# Patient Record
Sex: Female | Born: 1955 | Race: White | Hispanic: No | Marital: Married | Smoking: Never smoker
Health system: Southern US, Community
[De-identification: ages and names within clinical notes are randomized; demographics above are authoritative.]

---

## 2003-02-28 ENCOUNTER — Other Ambulatory Visit: Admission: RE | Admit: 2003-02-28 | Discharge: 2003-02-28 | Payer: Self-pay | Admitting: Obstetrics & Gynecology

## 2003-03-17 ENCOUNTER — Emergency Department (HOSPITAL_COMMUNITY): Admission: EM | Admit: 2003-03-17 | Discharge: 2003-03-17 | Payer: Self-pay | Admitting: Emergency Medicine

## 2003-03-17 ENCOUNTER — Encounter: Payer: Self-pay | Admitting: Emergency Medicine

## 2003-03-19 ENCOUNTER — Encounter: Admission: RE | Admit: 2003-03-19 | Discharge: 2003-03-19 | Payer: Self-pay | Admitting: Obstetrics and Gynecology

## 2003-03-19 ENCOUNTER — Encounter: Payer: Self-pay | Admitting: Obstetrics & Gynecology

## 2004-03-25 ENCOUNTER — Other Ambulatory Visit: Admission: RE | Admit: 2004-03-25 | Discharge: 2004-03-25 | Payer: Self-pay | Admitting: Obstetrics & Gynecology

## 2004-06-16 ENCOUNTER — Encounter: Admission: RE | Admit: 2004-06-16 | Discharge: 2004-06-16 | Payer: Self-pay | Admitting: Obstetrics & Gynecology

## 2005-05-06 ENCOUNTER — Emergency Department (HOSPITAL_COMMUNITY): Admission: EM | Admit: 2005-05-06 | Discharge: 2005-05-06 | Payer: Self-pay | Admitting: Emergency Medicine

## 2006-11-01 ENCOUNTER — Other Ambulatory Visit: Admission: RE | Admit: 2006-11-01 | Discharge: 2006-11-01 | Payer: Self-pay | Admitting: Family Medicine

## 2007-01-05 ENCOUNTER — Emergency Department (HOSPITAL_COMMUNITY): Admission: EM | Admit: 2007-01-05 | Discharge: 2007-01-05 | Payer: Self-pay | Admitting: Emergency Medicine

## 2007-03-08 ENCOUNTER — Ambulatory Visit (HOSPITAL_COMMUNITY): Admission: RE | Admit: 2007-03-08 | Discharge: 2007-03-08 | Payer: Self-pay | Admitting: Gastroenterology

## 2007-04-05 ENCOUNTER — Encounter: Admission: RE | Admit: 2007-04-05 | Discharge: 2007-04-05 | Payer: Self-pay | Admitting: Family Medicine

## 2007-05-13 ENCOUNTER — Emergency Department (HOSPITAL_COMMUNITY): Admission: EM | Admit: 2007-05-13 | Discharge: 2007-05-13 | Payer: Self-pay | Admitting: *Deleted

## 2008-04-10 ENCOUNTER — Encounter: Admission: RE | Admit: 2008-04-10 | Discharge: 2008-04-10 | Payer: Self-pay | Admitting: Family Medicine

## 2008-07-21 ENCOUNTER — Emergency Department (HOSPITAL_COMMUNITY): Admission: EM | Admit: 2008-07-21 | Discharge: 2008-07-21 | Payer: Self-pay | Admitting: Emergency Medicine

## 2008-08-14 ENCOUNTER — Other Ambulatory Visit: Admission: RE | Admit: 2008-08-14 | Discharge: 2008-08-14 | Payer: Self-pay | Admitting: Family Medicine

## 2008-09-11 ENCOUNTER — Ambulatory Visit: Payer: Self-pay | Admitting: Family Medicine

## 2009-02-20 ENCOUNTER — Emergency Department (HOSPITAL_COMMUNITY): Admission: EM | Admit: 2009-02-20 | Discharge: 2009-02-20 | Payer: Self-pay | Admitting: Family Medicine

## 2009-03-20 ENCOUNTER — Emergency Department (HOSPITAL_COMMUNITY): Admission: EM | Admit: 2009-03-20 | Discharge: 2009-03-20 | Payer: Self-pay | Admitting: Emergency Medicine

## 2009-07-10 ENCOUNTER — Ambulatory Visit: Payer: Self-pay | Admitting: Family Medicine

## 2009-07-10 DIAGNOSIS — R209 Unspecified disturbances of skin sensation: Secondary | ICD-10-CM

## 2009-07-10 DIAGNOSIS — IMO0001 Reserved for inherently not codable concepts without codable children: Secondary | ICD-10-CM | POA: Insufficient documentation

## 2009-07-16 ENCOUNTER — Encounter: Payer: Self-pay | Admitting: Family Medicine

## 2009-07-17 ENCOUNTER — Encounter: Payer: Self-pay | Admitting: Family Medicine

## 2009-07-17 LAB — CONVERTED CEMR LAB
AST: 21 units/L (ref 0–37)
Albumin: 4.2 g/dL (ref 3.5–5.2)
Alkaline Phosphatase: 65 units/L (ref 39–117)
CO2: 23 meq/L (ref 19–32)
Cholesterol: 188 mg/dL (ref 0–200)
HDL: 54 mg/dL (ref >39)
LDL Cholesterol: 118 mg/dL — ABNORMAL HIGH (ref 0–99)
Sodium: 141 meq/L (ref 135–145)
TSH: 12.294 microintl units/mL — ABNORMAL HIGH (ref 0.350–4.500)
Total Bilirubin: 0.5 mg/dL (ref 0.3–1.2)
Total CK: 78 units/L (ref 7–177)
Total Protein: 7.3 g/dL (ref 6.0–8.3)
Triglycerides: 82 mg/dL (ref <150)

## 2009-07-18 LAB — CONVERTED CEMR LAB: T3, Free: 2.8 pg/mL (ref 2.3–4.2)

## 2009-07-29 ENCOUNTER — Telehealth (INDEPENDENT_AMBULATORY_CARE_PROVIDER_SITE_OTHER): Payer: Self-pay | Admitting: *Deleted

## 2009-07-30 ENCOUNTER — Telehealth: Payer: Self-pay | Admitting: Family Medicine

## 2009-08-11 ENCOUNTER — Ambulatory Visit: Payer: Self-pay | Admitting: Family Medicine

## 2009-08-11 DIAGNOSIS — E039 Hypothyroidism, unspecified: Secondary | ICD-10-CM | POA: Insufficient documentation

## 2009-08-11 DIAGNOSIS — F411 Generalized anxiety disorder: Secondary | ICD-10-CM | POA: Insufficient documentation

## 2009-08-15 ENCOUNTER — Telehealth: Payer: Self-pay | Admitting: Family Medicine

## 2009-09-03 ENCOUNTER — Encounter: Payer: Self-pay | Admitting: Family Medicine

## 2009-09-04 ENCOUNTER — Encounter: Admission: RE | Admit: 2009-09-04 | Discharge: 2009-09-04 | Payer: Self-pay | Admitting: Internal Medicine

## 2009-09-09 ENCOUNTER — Telehealth: Payer: Self-pay | Admitting: Family Medicine

## 2009-10-14 ENCOUNTER — Ambulatory Visit: Payer: Self-pay | Admitting: Family Medicine

## 2009-10-14 DIAGNOSIS — J0110 Acute frontal sinusitis, unspecified: Secondary | ICD-10-CM

## 2009-11-14 ENCOUNTER — Telehealth: Payer: Self-pay | Admitting: Family Medicine

## 2009-12-05 ENCOUNTER — Telehealth (INDEPENDENT_AMBULATORY_CARE_PROVIDER_SITE_OTHER): Payer: Self-pay | Admitting: *Deleted

## 2009-12-09 ENCOUNTER — Ambulatory Visit: Payer: Self-pay | Admitting: Family Medicine

## 2009-12-11 ENCOUNTER — Telehealth: Payer: Self-pay | Admitting: Family Medicine

## 2009-12-15 ENCOUNTER — Ambulatory Visit: Payer: Self-pay | Admitting: Family Medicine

## 2009-12-22 ENCOUNTER — Telehealth: Payer: Self-pay | Admitting: Family Medicine

## 2010-04-09 IMAGING — US US SOFT TISSUE HEAD/NECK
1 series · 14 of 25 positions shown · non-contrast
Comparison: [HOSPITAL] chest x-ray 02/20/2009.

CLINICAL DATA: Goiter.

THYROID ULTRASOUND
TECHNIQUE: Ultrasound examination of the thyroid gland and
adjacent soft tissues was performed.

[Series 1: us soft tissue head/neck · 0.05mm/px · 14 of 44 slices shown]
[im 1/44]
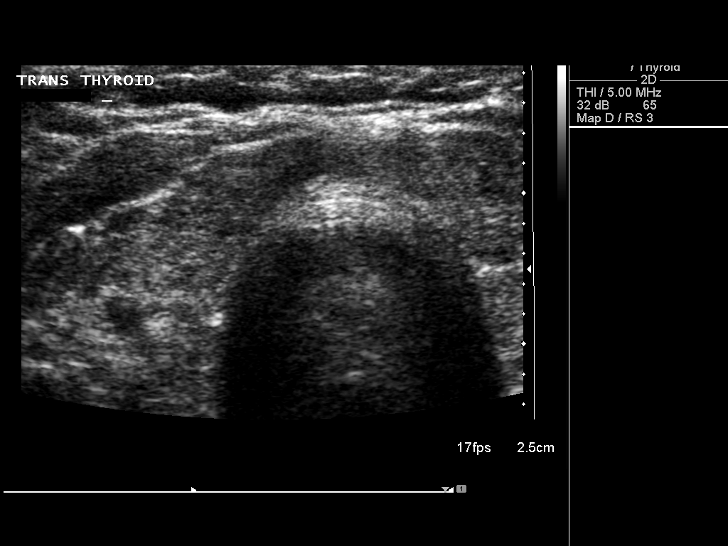
[im 4/44]
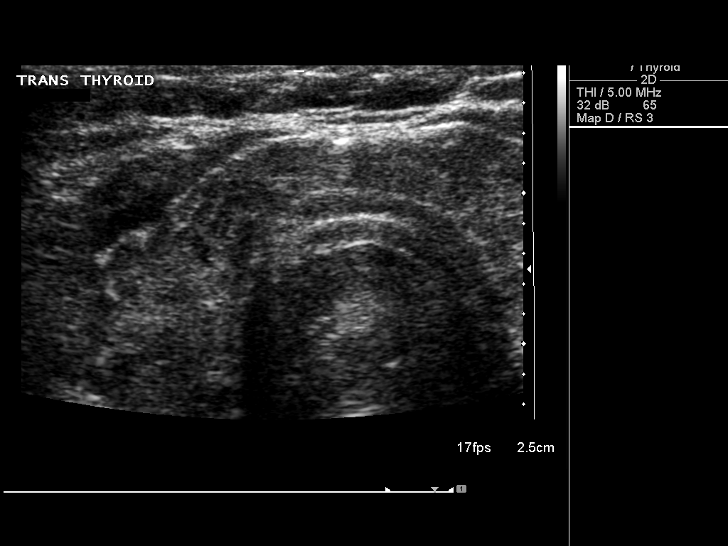
[im 8/44]
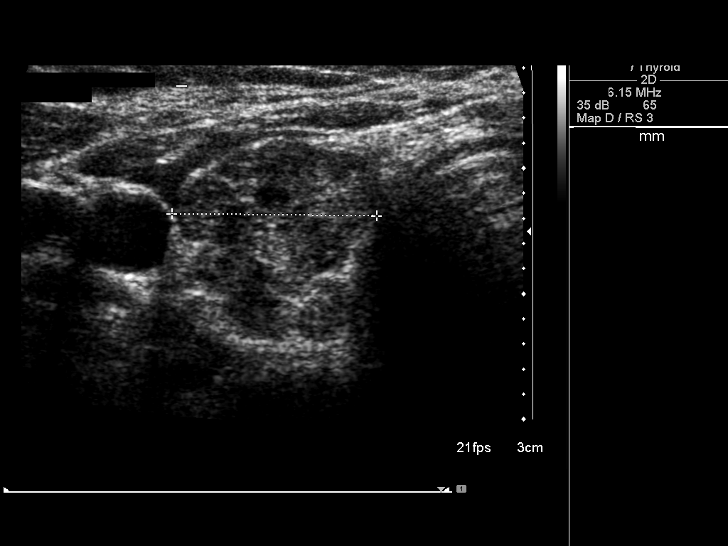
[im 11/44]
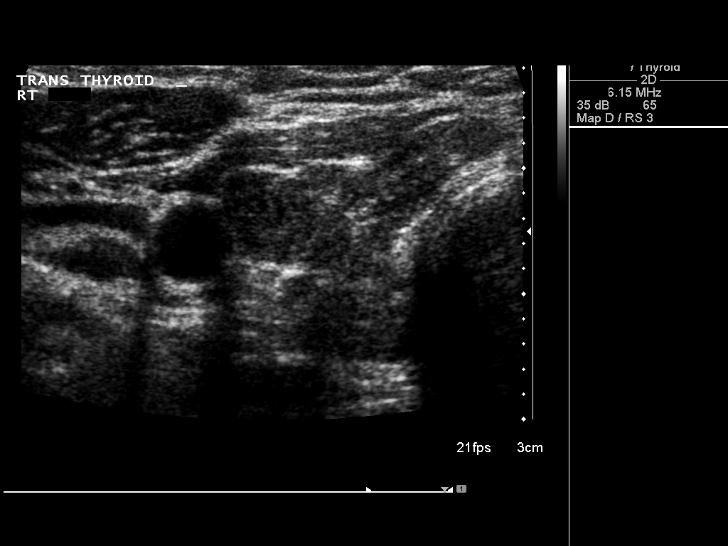
[im 15/44]
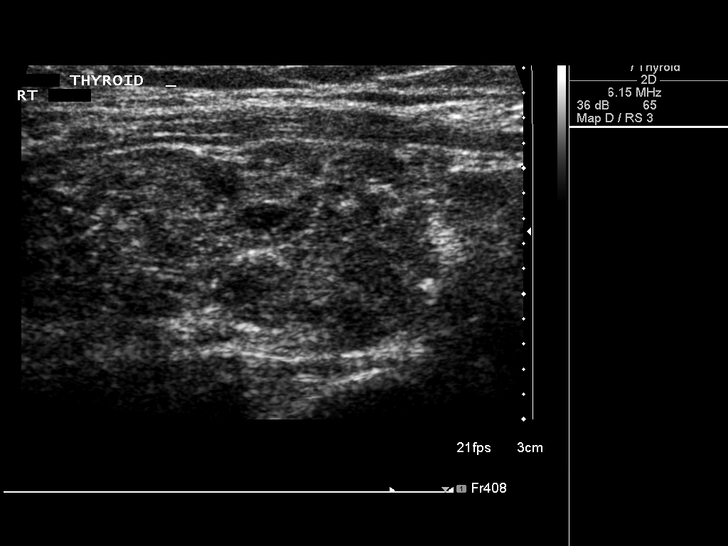
[im 17/44]
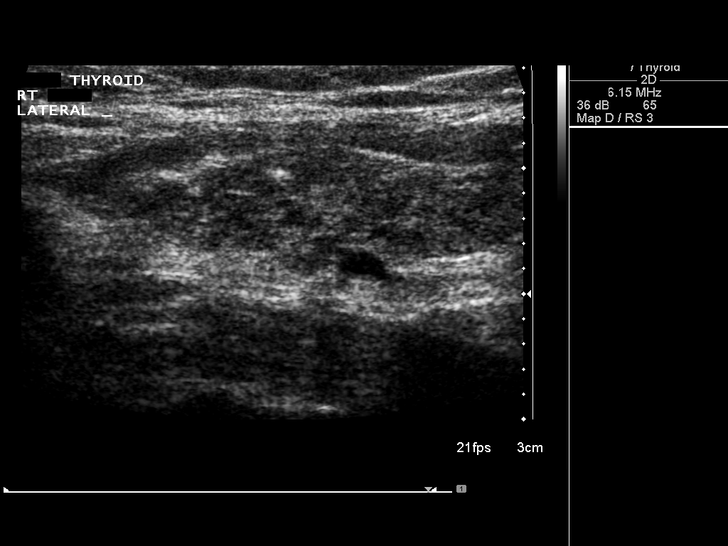
[im 20/44]
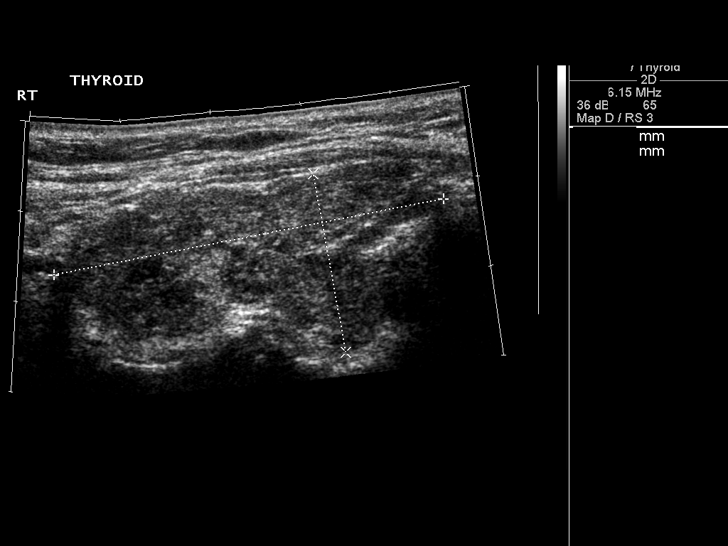
[im 24/44]
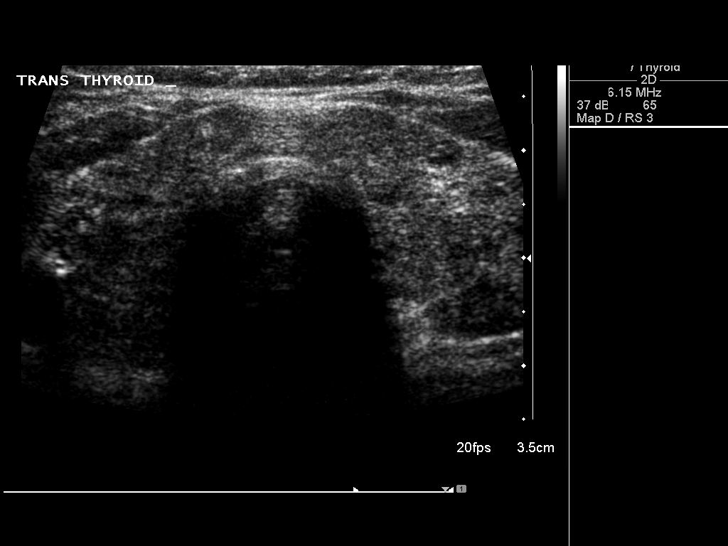
[im 27/44]
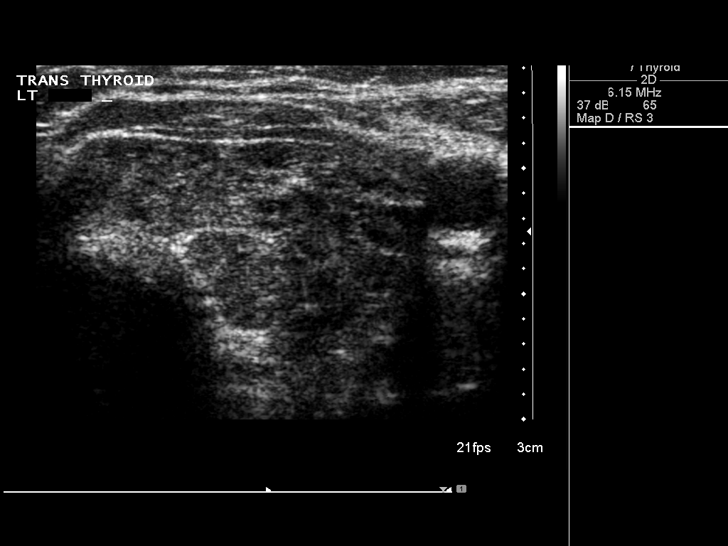
[im 29/44]
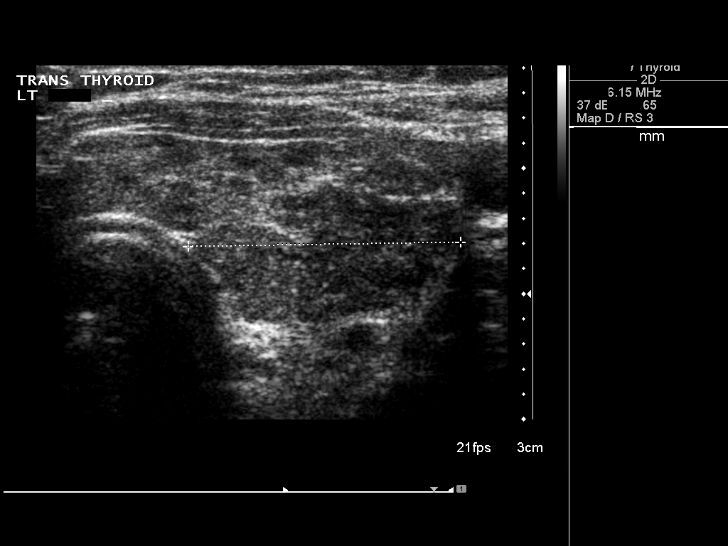
[im 33/44]
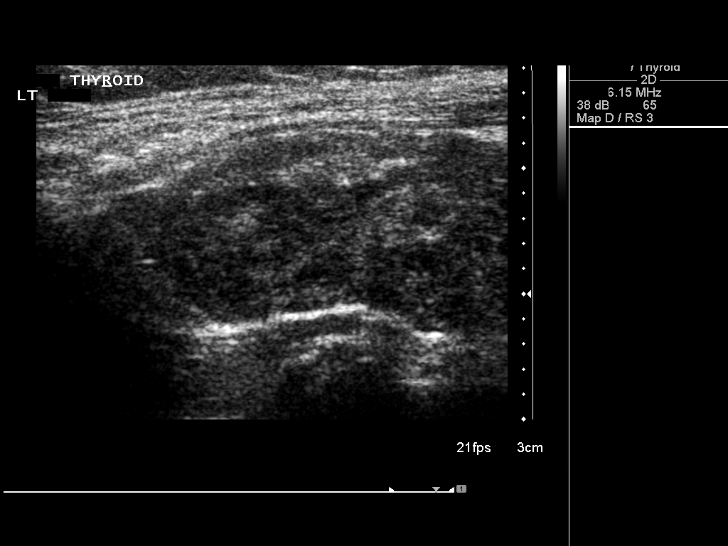
[im 36/44]
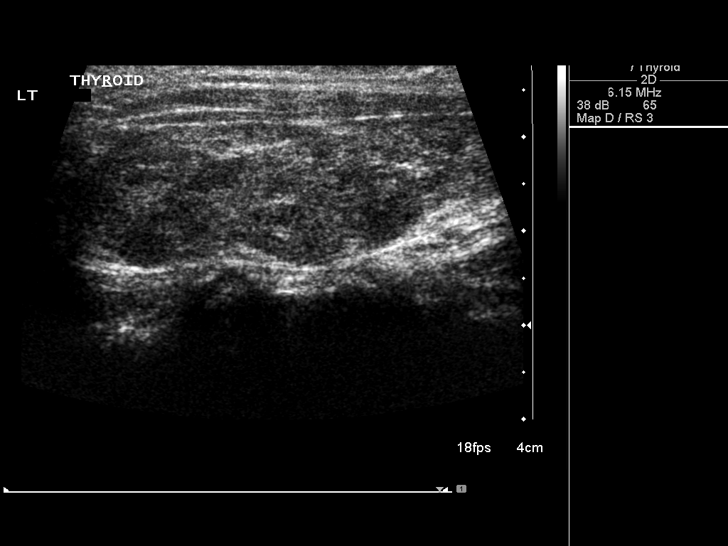
[im 40/44]
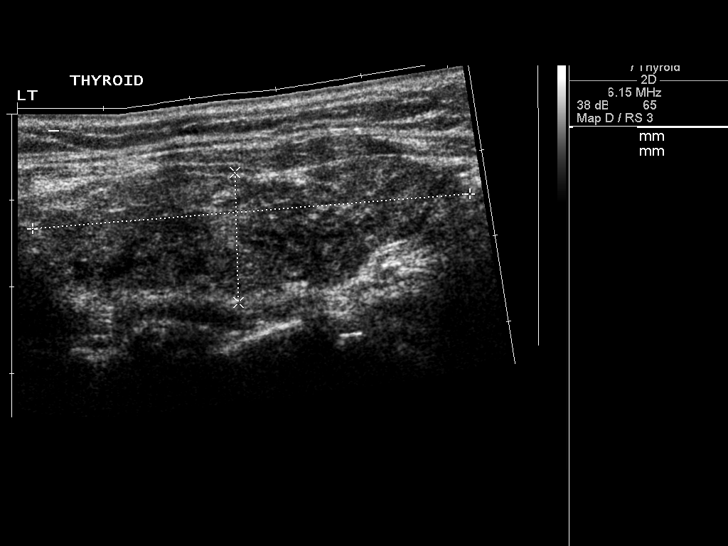
[im 44/44]
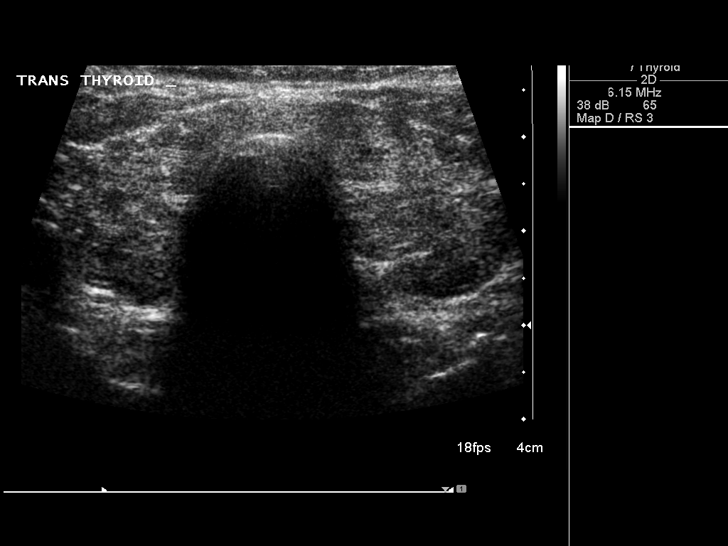

[14 of 25 positions shown; findings below may reference images not displayed]

FINDINGS: Thyroid isthmus measures upper limits of normal at 4 mm
AP thickness.  Remaining thyroid is upper limits of normal in size
with right lobe measuring 5.6 cm long X 2.0 cm AP X 1.6 cm wide and
left lobe 5.1 cm long X 1.5 cm AP X 2.2 cm wide.  Thyroid
echotexture is diffusely inhomogeneous.  No focal solid or cystic
nodules visualized.
IMPRESSION: 1.  Heterogeneous thyroid gland upper limits of normal in size with
no focal lesions consistent with subtle goiter and/or chronic
thyroiditis - need clinical correlation.
2.  No focal thyroid nodule.

## 2010-06-30 ENCOUNTER — Telehealth: Payer: Self-pay | Admitting: Family Medicine

## 2010-09-01 ENCOUNTER — Ambulatory Visit: Payer: Self-pay | Admitting: Family Medicine

## 2010-09-01 DIAGNOSIS — M549 Dorsalgia, unspecified: Secondary | ICD-10-CM | POA: Insufficient documentation

## 2010-09-08 ENCOUNTER — Encounter: Payer: Self-pay | Admitting: Family Medicine

## 2010-09-09 LAB — CONVERTED CEMR LAB
Alkaline Phosphatase: 73 units/L (ref 39–117)
Glucose, Bld: 92 mg/dL (ref 70–99)
Potassium: 4.1 meq/L (ref 3.5–5.3)
Sodium: 141 meq/L (ref 135–145)
Total Bilirubin: 0.6 mg/dL (ref 0.3–1.2)
Total CHOL/HDL Ratio: 4.1

## 2010-11-25 ENCOUNTER — Encounter: Payer: Self-pay | Admitting: Family Medicine

## 2010-12-07 ENCOUNTER — Encounter
Admission: RE | Admit: 2010-12-07 | Discharge: 2010-12-07 | Payer: Self-pay | Source: Home / Self Care | Attending: Family Medicine | Admitting: Family Medicine

## 2010-12-29 NOTE — Assessment & Plan Note (Signed)
Summary: f/u GAD   Vital Signs:  Patient profile:   55 year old female Menstrual status:  postmenopausal Height:      68 inches Weight:      196 pounds BMI:     29.91 O2 Sat:      96 % on Room air Pulse rate:   76 / minute BP sitting:   106 / 75  (left arm) Cuff size:   regular  Vitals Entered By: Payton Spark CMA (December 15, 2009 10:45 AM)  O2 Flow:  Room air CC: F/U anxiety. D/Cd alprazolam and doing well on klonopin   Primary Care Provider:  Seymour Bars DO  CC:  F/U anxiety. D/Cd alprazolam and doing well on klonopin.  History of Present Illness: 55 yo WF presents for f/u GAD.  She is taking 1/3 of a Klonopin q 8 hrs.  That seems to be working well.  She was on Xanax prior to this but felt like she was getting addicted.  She is having problems with her daughter and now her mother in law living with them.  She is starting family counseling soon.    Her husband is her support system.  Not drinking ETOH.  Sleeping better now.   She was tried on SSRIs in the past but had nightmares, GI upset amongst other problems.    Current Medications (verified): 1)  Klonopin 1mg  .... 1/2 Tab Two Times A Day For Anxiety  Allergies: 1)  ! Penicillin 2)  ! Levothroid (Levothyroxine Sodium)  Review of Systems      See HPI  Physical Exam  General:  alert, well-developed, well-nourished, and well-hydrated.   Skin:  color normal.   Psych:  good eye contact, not anxious appearing, and not depressed appearing.     Impression & Recommendations:  Problem # 1:  ANXIETY (ICD-300.00) Assessment Improved Improved with Clonazepam, off Alprazolam now. She is taking 1/3 tab q 8 hrs.  Starting counseling and has failed SSRIs by hx. Continue to follow. The following medications were removed from the medication list:    Alprazolam 0.25 Mg Tabs (Alprazolam) .Marland Kitchen... 1 tab by mouth three times a day as needed anxiety Her updated medication list for this problem includes:    Clonazepam 1 Mg Tabs  (Clonazepam) .Marland Kitchen... 1/2 tabs by mouth two times a day as needed anxiety  Complete Medication List: 1)  Clonazepam 1 Mg Tabs (Clonazepam) .... 1/2 tabs by mouth two times a day as needed anxiety  Patient Instructions: 1)  RFd Clonazepam. 2)  Start counseling. 3)  Spend time to relax each day. Prescriptions: CLONAZEPAM 1 MG TABS (CLONAZEPAM) 1/2 tabs by mouth two times a day as needed anxiety  #30 x 1   Entered and Authorized by:   Seymour Bars DO   Signed by:   Seymour Bars DO on 12/15/2009   Method used:   Printed then faxed to ...       [facility]  [place] [phone][facility] (retail[facility]       [address] [place], [place]  [address]       Ph: [phone] or [phone]       Fax: [phone]   RxID:   [id number]

## 2010-12-29 NOTE — Assessment & Plan Note (Signed)
Summary: VISIT/KH   Vital Signs:  Patient Profile:   55 Years Old Female CC:      Refill Klonapin Height:     68 inches O2 Sat:      95 % O2 treatment:    Room Air Temp:     98.3 degrees F oral Pulse rate:   77 / minute Pulse rhythm:   regular Resp:     12 per minute BP sitting:   117 / 82  (right arm) Cuff size:   regular  Vitals Entered By: Emilio Math (December 09, 2009 12:11 PM)                  Current Allergies: ! PENICILLIN ! LEVOTHROID (LEVOTHYROXINE SODIUM)History of Present Illness History from: patient Chief Complaint: Refill Klonapin History of Present Illness: xanax from [facility]md not controlling anxiety and panic which has been present for past 3 months and lmd office closed today, desires klonopin which has worked in the past  Current Meds GABAPENTIN 300 MG CAPS (GABAPENTIN) 1 capsule by mouth at bedtime x 1 wk then 2 capsules by mouth qhs MELOXICAM 7.5 MG TABS (MELOXICAM) 1-2 tabs by mouth daily with food as needed for back pain ALPRAZOLAM 0.25 MG TABS (ALPRAZOLAM) 1 tab by mouth three times a day as needed anxiety CEFTIN 250 MG TABS (CEFUROXIME AXETIL) 1 tab by mouth q 12 hrs x 10 days * KLONOPIN 1MG  1/2 tab two times a day for anxiety  REVIEW OF SYSTEMS Constitutional Symptoms      Denies fever, chills, night sweats, weight loss, weight gain, and fatigue.  Eyes       Denies change in vision, eye pain, eye discharge, glasses, contact lenses, and eye surgery. Ear/Nose/Throat/Mouth       Denies hearing loss/aids, change in hearing, ear pain, ear discharge, dizziness, frequent runny nose, frequent nose bleeds, sinus problems, sore throat, hoarseness, and tooth pain or bleeding.  Respiratory       Denies dry cough, productive cough, wheezing, shortness of breath, asthma, bronchitis, and emphysema/COPD.  Cardiovascular       Denies murmurs, chest pain, and tires easily with exhertion.    Gastrointestinal       Denies stomach pain, nausea/vomiting, diarrhea,  constipation, blood in bowel movements, and indigestion. Genitourniary       Denies painful urination, kidney stones, and loss of urinary control. Neurological       Denies paralysis, seizures, and fainting/blackouts. Musculoskeletal       Denies muscle pain, joint pain, joint stiffness, decreased range of motion, redness, swelling, muscle weakness, and gout.  Skin       Denies bruising, unusual mles/lumps or sores, and hair/skin or nail changes.  Psych       Complains of depression and sleep problems.      Denies mood changes, temper/anger issues, anxiety/stress, and speech problems.  Past History:  Past Medical History: Reviewed history from 10/14/2009 and no changes required. ? MS -- Dr Isabell Jarvis hypothyroidism-- Dr Katrine Coho Postmenopausal  Past Surgical History: Reviewed history from 07/10/2009 and no changes required. none PMH-FH-SH reviewed-no changes except otherwise noted  Family History: Reviewed history from 07/10/2009 and no changes required. sister endometriosis brother glaucoma mother breast cancer 52s, T2DM father renal cancer, died at 65, AMI < 37.  Social History: Reviewed history from 07/10/2009 and no changes required. [facility] for adopted granddaughters. Married to [place]. Quit smoking in 2000. Denies ETOH. No regular exercise.  Fair diet. Physical Exam General appearance: well  developed, well nourished, no acute distress MSE: oriented to time, place, and person,calm and coop Assessment New Problems: ANXIETY (ICD-300.00)   Plan New Medications/Changes: KLONOPIN 1MG  1/2 tab two times a day for anxiety  #10 x 0, 12/09/2009, Linna Hoff MD   The patient and/or caregiver has been counseled thoroughly with regard to medications prescribed including dosage, schedule, interactions, rationale for use, and possible side effects and they verbalize understanding.  Diagnoses and expected course of recovery discussed and will return if not improved as expected or  if the condition worsens. Patient and/or caregiver verbalized understanding.  Prescriptions: KLONOPIN 1MG  1/2 tab two times a day for anxiety  #10 x 0   Entered and Authorized by:   Linna Hoff MD   Signed by:   Linna Hoff MD on 12/09/2009   Method used:   Telephoned to ...       [facility]  [place] [phone]* (retail)       [address] [place], [place]  [address]       Ph: [phone] or [phone]       Fax: [phone]   RxID:   [id number]

## 2010-12-29 NOTE — Assessment & Plan Note (Signed)
Summary: f/u anxiety   Vital Signs:  Patient profile:   55 year old female Menstrual status:  postmenopausal Height:      68 inches Weight:      203 pounds BMI:     30.98 O2 Sat:      96 % on Room air Pulse rate:   73 / minute BP sitting:   113 / 83  (left arm) Cuff size:   regular  Vitals Entered By: Payton Spark CMA (September 01, 2010 1:18 PM)  O2 Flow:  Room air CC: F/U    Primary Care Emmette Katt:  Seymour Bars DO  CC:  F/U .  History of Present Illness: 55 yo WF presents for f/u visit.  She went to the gym yesterday and she fell.  She tripped.  She twisted and her thoracic back and has some pain and spasm today.  Has some muscle spasm.    She is due for fasting labs. Her anxiety has been fairly good.  She is taking 14/ of Klonopin in the AM and 1/2 tab at night which seems to be working well.    Her endocrinologist just restarted her thyroid medicine.    Current Medications (verified): 1)  Clonazepam 1 Mg Tabs (Clonazepam) .... 1/2 Tabs By Mouth Two Times A Day As Needed Anxiety 2)  Omeprazole 40 Mg Cpdr (Omeprazole) .Marland Kitchen.. 1 Tab By Mouth Daily, 30 Min Prior To Breakfast 3)  Aspirin 81 Mg Tbec (Aspirin) .... Take 1 Tab By Mouth Once Daily  Allergies (verified): 1)  ! Penicillin 2)  ! Levothroid (Levothyroxine Sodium)  Past History:  Past Medical History: Reviewed history from 10/14/2009 and no changes required. ? MS -- Dr Isabell Jarvis hypothyroidism-- Dr Katrine Coho Postmenopausal  Social History: Reviewed history from 07/10/2009 and no changes required. [facility] for adopted granddaughters. Married to [place]. Quit smoking in 2000. Denies ETOH. No regular exercise.  Fair diet.  Review of Systems      See HPI  Physical Exam  General:  alert, well-developed, well-nourished, and well-hydrated.   Head:  normocephalic and atraumatic.   Mouth:  good dentition and pharynx pink and moist.   Lungs:  Normal respiratory effort, chest expands symmetrically. Lungs are clear to  auscultation, no crackles or wheezes. Heart:  Normal rate and regular rhythm. S1 and S2 normal without gallop, murmur, click, rub or other extra sounds. Msk:  muscle spasm over the L upper thoracics  good ROM.   Skin:  color normal.   Cervical Nodes:  No lymphadenopathy noted Psych:  good eye contact, not anxious appearing, and not depressed appearing.     Impression & Recommendations:  Problem # 1:  BACK PAIN (ICD-724.5) Muscle spasm from falling.  Treat with heat, stretching and NSAIDs. Her updated medication list for this problem includes:    Aspirin 81 Mg Tbec (Aspirin) .Marland Kitchen... Take 1 tab by mouth once daily  Problem # 2:  ANXIETY (ICD-300.00) Will treat iwth Clonazepam as needed.  Working well. Her updated medication list for this problem includes:    Clonazepam 1 Mg Tabs (Clonazepam) .Marland Kitchen... 1/2 tabs by mouth two times a day as needed anxiety  Complete Medication List: 1)  Clonazepam 1 Mg Tabs (Clonazepam) .... 1/2 tabs by mouth two times a day as needed anxiety 2)  Omeprazole 40 Mg Cpdr (Omeprazole) .Marland Kitchen.. 1 tab by mouth daily, 30 min prior to breakfast 3)  Aspirin 81 Mg Tbec (Aspirin) .... Take 1 tab by mouth once daily  Other Orders:  T-Comprehensive Metabolic Panel [phone]) T-Lipid Profile [id number])  Patient Instructions: 1)  Update fasting labs. 2)  will call you w/ results. 3)  Use Ibuprofen 3 tabs (600 mg) up to 3 x a day with food for muscle  spasm.  Use heating pad and icy hot as needed also. 4)  Clonazepam and Omeprazole RFd. 5)  Set up PHYSICAL in 4 mos. Prescriptions: OMEPRAZOLE 40 MG CPDR (OMEPRAZOLE) 1 tab by mouth daily, 30 min prior to breakfast  #30 x 6   Entered and Authorized by:   Seymour Bars DO   Signed by:   Seymour Bars DO on 09/01/2010   Method used:   Electronically to        [facility] [phone]* (retail)       [address]       [place], [place]  [address]       Ph: [phone] or [phone]       Fax: [phone]   RxID:    [id number] CLONAZEPAM 1 MG TABS (CLONAZEPAM) 1/2 tabs by mouth two times a day as needed anxiety  #30 x 0   Entered and Authorized by:   Seymour Bars DO   Signed by:   Seymour Bars DO on 09/01/2010   Method used:   Printed then faxed to ...       [facility]  [address] [phone]* (retail)       [address] [place], [place]  [address]       Ph: [phone] or [phone]       Fax: [phone]   RxID:   [id number]

## 2010-12-29 NOTE — Progress Notes (Signed)
Summary: Rx omeprazole  Phone Note Call from Patient   Caller: Patient Summary of Call: Pt has been taking OTC nexium and would like to know if you will send Rx for omeprazole to pharm. Please advise.  Initial call taken by: Payton Spark CMA,  December 22, 2009 3:55 PM    New/Updated Medications: OMEPRAZOLE 40 MG CPDR (OMEPRAZOLE) 1 tab by mouth daily, 30 min prior to breakfast Prescriptions: OMEPRAZOLE 40 MG CPDR (OMEPRAZOLE) 1 tab by mouth daily, 30 min prior to breakfast  #30 x 3   Entered and Authorized by:   Seymour Bars DO   Signed by:   Seymour Bars DO on 12/22/2009   Method used:   Electronically to        [facility] [phone]* (retail)       [address] [place], [place]  [address]       Ph: [phone] or [phone]       Fax: [phone]   RxID:   [id number]   Appended Document: Rx omeprazole Pt aware

## 2010-12-29 NOTE — Progress Notes (Signed)
Summary: Changed to Klonopin  Phone Note Call from Patient   Caller: Patient Summary of Call: Pt called to inform you that see went to UC for anxiety. Doc at [facility] changed her to klonopin for better control of anxiety but only gave her #10. Pt would like to know if you will continue to fill Rx. Please advise.  Initial call taken by: Payton Spark CMA,  December 11, 2009 9:02 AM  Follow-up for Phone Call        she will need to f/u with me in 2 wks prior to RFing. Follow-up by: Seymour Bars DO,  December 11, 2009 10:17 AM     Appended Document: Changed to Klonopin Pt aware and scheduled

## 2010-12-29 NOTE — Progress Notes (Signed)
Summary: xanax not due yet  Phone Note Outgoing Call   Summary of Call: Marcelino Duster, Pls let pt know that I recieved a RF request for her xanax but she is not due until after 1-17.   Initial call taken by: Seymour Bars DO,  December 05, 2009 2:58 PM  Follow-up for Phone Call        Pt aware. Follow-up by: Payton Spark CMA,  December 08, 2009 9:39 AM

## 2010-12-29 NOTE — Progress Notes (Signed)
Summary: clonazepam refill  Phone Note Refill Request Message from:  Pharmacy on June 30, 2010 12:08 PM  Refills Requested: Medication #1:  CLONAZEPAM 1 MG TABS 1/2 tabs by mouth two times a day as needed anxiety Initial call taken by: Payton Spark CMA,  June 30, 2010 12:08 PM    Prescriptions: CLONAZEPAM 1 MG TABS (CLONAZEPAM) 1/2 tabs by mouth two times a day as needed anxiety  #30 x 0   Entered and Authorized by:   Seymour Bars DO   Signed by:   Seymour Bars DO on 06/30/2010   Method used:   Printed then faxed to ...       [facility]  [address] [phone]* (retail)       [address] [place], [place]  [address]       Ph: [phone] or [phone]       Fax: [phone]   RxID:   [id number]

## 2010-12-31 NOTE — Letter (Signed)
Summary: [facility] Endocr[facility] Diabetes  [facility] Endocr[facility]   Imported By: Lanelle Bal 12/09/2010 09:21:54  _____________________________________________________________________  External Attachment:    Type:   Image     Comment:   External Document

## 2011-01-04 ENCOUNTER — Encounter: Payer: Self-pay | Admitting: Family Medicine

## 2011-01-04 ENCOUNTER — Encounter: Admitting: Family Medicine

## 2011-01-04 DIAGNOSIS — E039 Hypothyroidism, unspecified: Secondary | ICD-10-CM

## 2011-01-04 DIAGNOSIS — F411 Generalized anxiety disorder: Secondary | ICD-10-CM

## 2011-01-14 NOTE — Assessment & Plan Note (Signed)
Summary: f/u anxiety   Vital Signs:  Patient profile:   55 year old female Menstrual status:  postmenopausal Height:      68 inches Weight:      204 pounds BMI:     31.13 O2 Sat:      97 % on Room air Pulse rate:   75 / minute BP sitting:   111 / 77  (left arm) Cuff size:   large  Vitals Entered By: Payton Spark CMA (January 04, 2011 9:27 AM)  O2 Flow:  Room air CC: F/U.    Primary Care Provider:  Seymour Bars DO  CC:  F/U. Marland Kitchen  History of Present Illness: Catherine Brooks presents for f/u visit.  She is doing well.  She is busy with the kids.  Her eldest has bipolar disrorder, newly diagnosed which has been a source of stress.  She feels well overall.  Dr Lucianne Muss finally has her thyroid better regulated which has helped her mood and her energy level.  She is still needing Clonazepam 2 x a day everyday for anxiety.  Her labs are UTD.      Current Medications (verified): 1)  Clonazepam 1 Mg Tabs (Clonazepam) .... 1/2 Tabs By Mouth Two Times A Day As Needed Anxiety 2)  Omeprazole 40 Mg Cpdr (Omeprazole) .Marland Kitchen.. 1 Tab By Mouth Daily, 30 Min Prior To Breakfast 3)  Aspirin 81 Mg Tbec (Aspirin) .... Take 1 Tab By Mouth Once Daily 4)  Levoxyl 25 Mcg Tabs (Levothyroxine Sodium) .... Take 1 Tab By Mouth Once Daily  Allergies (verified): 1)  ! Penicillin 2)  ! Levothroid (Levothyroxine Sodium)  Past History:  Past Medical History: hypothyroidism-- Dr Katrine Coho Postmenopausal  neuro Dr Loleta Chance  Past Surgical History: Reviewed history from 07/10/2009 and no changes required. none  Family History: Reviewed history from 07/10/2009 and no changes required. sister endometriosis brother glaucoma mother breast cancer 47s, T2DM father renal cancer, died at 58, AMI < 18.  Social History: Reviewed history from 07/10/2009 and no changes required. [facility] for adopted granddaughters. Married to [place]. Quit smoking in 2000. Denies ETOH. No regular exercise.  Fair diet.  Review of Systems     See HPI  Physical Exam  General:  alert, well-developed, well-nourished, well-hydrated, and overweight-appearing.   Head:  normocephalic and atraumatic.   Eyes:  no proptosis, wears glasses Mouth:  pharynx pink and moist.   Neck:  no masses and no thyromegaly.   Lungs:  Normal respiratory effort, chest expands symmetrically. Lungs are clear to auscultation, no crackles or wheezes. Heart:  Normal rate and regular rhythm. S1 and S2 normal without gallop, murmur, click, rub or other extra sounds. Extremities:  trace LE edema bilat Neurologic:  no tremor Psych:  good eye contact, not anxious appearing, and not depressed appearing.     Impression & Recommendations:  Problem # 1:  ANXIETY (ICD-300.00) Improved.  Stable on current meds.  Will continue. Her updated medication list for this problem includes:    Clonazepam 1 Mg Tabs (Clonazepam) .Marland Kitchen... 1/2 tabs by mouth two times a day as needed anxiety  Problem # 2:  UNSPECIFIED HYPOTHYROIDISM (ICD-244.9) Managed by Dr Lucianne Muss and doing well.  Plans to [facility] for CPE in Oct. Her updated medication list for this problem includes:    Levoxyl 25 Mcg Tabs (Levothyroxine sodium) .Marland Kitchen... Take 1 tab by mouth once daily  Complete Medication List: 1)  Clonazepam 1 Mg Tabs (Clonazepam) .... 1/2 tabs by mouth two times a  day as needed anxiety 2)  Omeprazole 40 Mg Cpdr (Omeprazole) .Marland Kitchen.. 1 tab by mouth daily, 30 min prior to breakfast 3)  Aspirin 81 Mg Tbec (Aspirin) .... Take 1 tab by mouth once daily 4)  Levoxyl 25 Mcg Tabs (Levothyroxine sodium) .... Take 1 tab by mouth once daily  Patient Instructions: 1)  Return for a PHYSICAL after 09-09-2011.   Orders Added: 1)  Est. Patient Level III [[address]]

## 2011-01-26 ENCOUNTER — Telehealth: Payer: Self-pay | Admitting: Family Medicine

## 2011-02-04 NOTE — Progress Notes (Signed)
Summary: [facility] Refill  Phone Note Refill Request Call back at Home Phone [phone] Message from:  Patient  Refills Requested: Medication #1:  CLONAZEPAM 1 MG TABS 1/2 tabs by mouth two times a day as needed anxiety   Supply Requested: 1 month   Last Refilled: 01/01/2011 pt will be due for refill on Saturday.  Was told by pharmacy that rx was denied by office.  Pt will be in office tomorrow with her husband and will be asking about status of rx.   Method Requested: Fax to Local Pharmacy Initial call taken by: Francee Piccolo CMA Duncan Dull),  January 26, 2011 4:35 PM  Follow-up for Phone Call        REFILL IS NOT DUE YET!  PLS SEE THE LAST DATE OF FILL. Follow-up by: Seymour Bars DO,  January 27, 2011 10:25 AM

## 2011-02-25 ENCOUNTER — Other Ambulatory Visit: Payer: Self-pay | Admitting: *Deleted

## 2011-02-25 DIAGNOSIS — F419 Anxiety disorder, unspecified: Secondary | ICD-10-CM

## 2011-02-25 MED ORDER — CLONAZEPAM 1 MG PO TABS: ORAL_TABLET | ORAL | Status: DC

## 2011-03-25 ENCOUNTER — Other Ambulatory Visit: Payer: Self-pay | Admitting: *Deleted

## 2011-03-25 DIAGNOSIS — F419 Anxiety disorder, unspecified: Secondary | ICD-10-CM

## 2011-03-25 MED ORDER — CLONAZEPAM 1 MG PO TABS: ORAL_TABLET | ORAL | Status: DC

## 2011-03-29 ENCOUNTER — Other Ambulatory Visit: Payer: Self-pay | Admitting: Family Medicine

## 2011-04-16 NOTE — Op Note (Signed)
NAMESONAL, DORWART              ACCOUNT NO.:  [id number]   MEDICAL RECORD NO.:  [id number]          PATIENT TYPE:  AMB   LOCATION:  ENDO                         FACILITY:  [facility]   PHYSICIAN:  Anselmo Rod, M.D.  DATE OF BIRTH:  1956-08-23   DATE OF PROCEDURE:  03/08/2007  DATE OF DISCHARGE:                               OPERATIVE REPORT   PROCEDURE PERFORMED:  Screening colonoscopy.   ENDOSCOPIST:  Anselmo Rod, M.D.   INSTRUMENT USED:  Pentax video colonoscope.   INDICATIONS FOR PROCEDURE:  55 year old white female undergoing  screening colonoscopy to rule out colonic polyps, masses, etc.   PREPROCEDURE PREPARATION:  Informed consent was procured from the  patient.  The patient fasted for 4 hours prior to the procedure and  prepped with 20 MiraLax pills the night of and 12 MiraLax the morning of  the procedure.  Risks and benefits of the procedure including a 10% miss  rate of cancer and polyp were discussed with the patient as well.   PREPROCEDURE PHYSICAL:  The patient had stable vital signs.  Neck  supple.  Chest clear to auscultation.  S1, S2 regular.  Abdomen soft  with normal bowel sounds.   DESCRIPTION OF PROCEDURE:  The patient was placed in the left lateral  decubitus position and sedated with 150 mcg of Fentanyl and 16 mg of  Versed given intravenously in slow incremental doses.  Once the patient  was adequately sedated and maintained on low-flow oxygen and continuous  cardiac monitoring the Pentax video colonoscope was advanced from the  rectum to cecum with extreme difficulty.  The patient had a very  tortuous colon.  The patient's position was changed from the left  lateral to the supine and the right lateral position with gentle  application of abdominal pressure to reach the cecal base.  The  appendiceal orifice and cecal valve were visualized and photographed.  There was some residual stool in the colon.  Multiple washes were done.  No masses or  polyps were seen.  There was no evidence of diverticulosis.  Small internal hemorrhoids were appreciated on retroflexion in the  rectum.  The patient tolerated the procedure well without complication.   IMPRESSION:  1. Healthy appearing colonic mucosa with no masses, polyps or      diverticulosis noted.  2. Tortuous colon difficult exam.  3. Small internal hemorrhoids seen on retroflexion.  4. The patient had a significant abdominal discomfort with      insufflation of air into the colon indicating a component of      visceral hypersensitivity most consistent with an irritable bowel      syndrome.   RECOMMENDATIONS:  1. Continue high fiber diet with liberal fluid intake.  2. Repeat colonoscopy in the next 10 years.  If the patient has any      abnormal symptoms in interim, she should contact the office      immediately for further recommendations.  3. Continue high fiber diet with liberal fluid intake.      Anselmo Rod, M.D.  Electronically Signed     JNM/MEDQ  D:  03/08/2007  T:  03/08/2007  Job:  [id number]   cc:   Joycelyn Rua, M.D.

## 2011-05-20 ENCOUNTER — Other Ambulatory Visit: Payer: Self-pay | Admitting: *Deleted

## 2011-05-20 DIAGNOSIS — F419 Anxiety disorder, unspecified: Secondary | ICD-10-CM

## 2011-05-20 MED ORDER — CLONAZEPAM 1 MG PO TABS: ORAL_TABLET | ORAL | Status: DC

## 2011-06-24 ENCOUNTER — Other Ambulatory Visit: Payer: Self-pay | Admitting: Endocrinology

## 2011-06-24 DIAGNOSIS — E049 Nontoxic goiter, unspecified: Secondary | ICD-10-CM

## 2011-06-25 ENCOUNTER — Ambulatory Visit
Admission: RE | Admit: 2011-06-25 | Discharge: 2011-06-25 | Disposition: A | Discharge status: Home / Self Care | Payer: 59 | Source: Ambulatory Visit | Attending: Endocrinology | Admitting: Endocrinology

## 2011-06-25 DIAGNOSIS — E049 Nontoxic goiter, unspecified: Secondary | ICD-10-CM

## 2011-07-14 ENCOUNTER — Other Ambulatory Visit: Payer: Self-pay | Admitting: *Deleted

## 2011-07-14 ENCOUNTER — Other Ambulatory Visit: Payer: Self-pay | Admitting: Family Medicine

## 2011-07-14 DIAGNOSIS — F419 Anxiety disorder, unspecified: Secondary | ICD-10-CM

## 2011-07-14 MED ORDER — CLONAZEPAM 1 MG PO TABS: ORAL_TABLET | ORAL | Status: DC

## 2011-07-14 NOTE — Telephone Encounter (Signed)
Seh can call Monday for her refill.

## 2011-07-14 NOTE — Telephone Encounter (Signed)
Pt called and needs refill clonazepam 1 mg.  [facility]11[facility]  We sent electronic script according to med list on 05-20-11 which means this med should be good through 07-20-11.  Please advise if we can refill the clonazepam.   Plan:  [facility]ill [facility].  They filled the script last on 06-14-11 just like the pt said.   Please advise if we can send a new script.  Pt had recent OV for anxiety in Feb 2012 and not due for CPE til after 09-10-11 per Dr. Cathey Endow.

## 2011-07-14 NOTE — Telephone Encounter (Signed)
Pt notified that her clonazepam will not be refilled until next Monday 07-19-11.   Jarvis Newcomer, LPN [facility]

## 2011-09-01 ENCOUNTER — Other Ambulatory Visit: Payer: Self-pay | Admitting: *Deleted

## 2011-09-01 MED ORDER — OMEPRAZOLE 40 MG PO CPDR: 40.0000 mg | DELAYED_RELEASE_CAPSULE | Freq: Every day | ORAL | Status: DC

## 2011-09-14 ENCOUNTER — Other Ambulatory Visit: Payer: Self-pay | Admitting: Family Medicine

## 2011-09-14 DIAGNOSIS — F419 Anxiety disorder, unspecified: Secondary | ICD-10-CM

## 2011-09-14 MED ORDER — CLONAZEPAM 1 MG PO TABS: ORAL_TABLET | ORAL | Status: DC

## 2011-09-14 NOTE — Telephone Encounter (Signed)
[facility] called for the pt requesting refill of her clonazepam 1mg .  Pt is due for CPE this month of Oct 2012 per Dr. Cathey Endow last office note.  Pharmacist will contact the pt and let her know that she needs to schedule after this refill before any more med will be sent. Plan:  #30/0 refills given electronically. Jarvis Newcomer, LPN [facility]

## 2011-09-15 ENCOUNTER — Encounter (INDEPENDENT_AMBULATORY_CARE_PROVIDER_SITE_OTHER): Payer: Self-pay | Admitting: *Deleted

## 2011-09-15 ENCOUNTER — Inpatient Hospital Stay (INDEPENDENT_AMBULATORY_CARE_PROVIDER_SITE_OTHER)
Admission: RE | Admit: 2011-09-15 | Discharge: 2011-09-15 | Disposition: A | Discharge status: Home / Self Care | Payer: 59 | Source: Ambulatory Visit | Attending: Family Medicine | Admitting: Family Medicine

## 2011-09-15 DIAGNOSIS — Z23 Encounter for immunization: Secondary | ICD-10-CM

## 2011-09-16 ENCOUNTER — Other Ambulatory Visit: Payer: Self-pay | Admitting: Family Medicine

## 2011-09-16 NOTE — Telephone Encounter (Signed)
[facility] called and requested a refill for clonazepam 1 mg. Plan:  Reviewed the the pt chart file and the script was sent on 09-14-11, and pharm is saying they did not receive it; therefore, the script was given verbally to the pharm. Catherine Newcomer, LPN [facility]

## 2011-10-20 ENCOUNTER — Other Ambulatory Visit: Payer: Self-pay | Admitting: *Deleted

## 2011-10-20 DIAGNOSIS — F419 Anxiety disorder, unspecified: Secondary | ICD-10-CM

## 2011-10-20 MED ORDER — CLONAZEPAM 1 MG PO TABS: ORAL_TABLET | ORAL | Status: DC

## 2011-11-01 NOTE — Assessment & Plan Note (Signed)
Summary: FLU VACCINE...[facility]  Nurse Visit   Vital Signs:  Patient profile:   55 year old female Menstrual status:  postmenopausal Temp:     98.7 degrees F oral  Vitals Entered By: Clemens Catholic LPN (September 15, 2011 5:41 PM)  Allergies: 1)  ! Penicillin 2)  ! Levothroid (Levothyroxine Sodium)  Immunizations Administered:  Influenza Vaccine:    Vaccine Type: Fluvax 3+    Site: left deltoid    Mfr: GlaxoSmithKline    Dose: 0.5 ml    Route: IM    Given by: Clemens Catholic LPN    Exp. Date: 05/27/2012    Lot #: Sandre Kitty    VIS given: 06/23/10 version given September 15, 2011.  Orders Added: 1)  Flu Vaccine 43yrs + [90658] 2)  Admin 1st Vaccine [90471]   Immunizations Administered:  Influenza Vaccine:    Vaccine Type: Fluvax 3+    Site: left deltoid    Mfr: GlaxoSmithKline    Dose: 0.5 ml    Route: IM    Given by: Clemens Catholic LPN    Exp. Date: 05/27/2012    Lot #: Sandre Kitty    VIS given: 06/23/10 version given September 15, 2011.  Flu Vaccine Consent Questions:    Do you have a history of severe allergic reactions to this vaccine? no    Any prior history of allergic reactions to egg and/or gelatin? no    Do you have a sensitivity to the preservative Thimersol? no    Do you have a past history of Guillan-Barre Syndrome? no    Do you currently have an acute febrile illness? no    Have you ever had a severe reaction to latex? no    Vaccine information given and explained to patient? yes    Are you currently pregnant? no

## 2011-12-21 ENCOUNTER — Other Ambulatory Visit: Payer: Self-pay | Admitting: Family Medicine

## 2011-12-23 ENCOUNTER — Other Ambulatory Visit: Payer: Self-pay | Admitting: *Deleted

## 2011-12-23 DIAGNOSIS — F419 Anxiety disorder, unspecified: Secondary | ICD-10-CM

## 2011-12-23 MED ORDER — CLONAZEPAM 1 MG PO TABS: ORAL_TABLET | ORAL | Status: DC

## 2012-01-19 ENCOUNTER — Other Ambulatory Visit: Payer: Self-pay | Admitting: *Deleted

## 2012-01-19 DIAGNOSIS — F419 Anxiety disorder, unspecified: Secondary | ICD-10-CM

## 2012-01-19 MED ORDER — CLONAZEPAM 1 MG PO TABS: ORAL_TABLET | ORAL | Status: DC

## 2012-01-28 IMAGING — US US SOFT TISSUE HEAD/NECK
1 series · 14 of 25 positions shown · non-contrast
Comparison: [HOSPITAL] at [REDACTED] [HOSPITAL] thyroid
ultrasound 09/04/2009.

CLINICAL DATA: Goiter.

THYROID ULTRASOUND
TECHNIQUE: Ultrasound examination of the thyroid gland and adjacent
soft tissues was performed.

[Series 1: us soft tissue head/neck · 0.08mm/px · 14 of 31 slices shown]
[im 1/31]
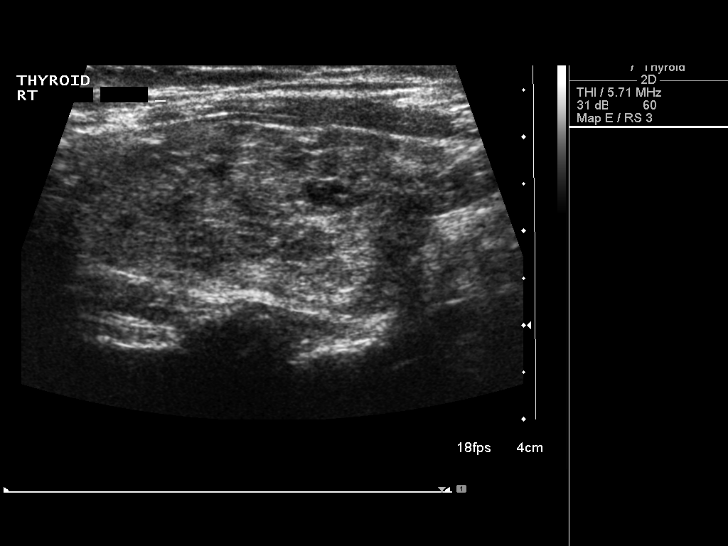
[im 3/31]
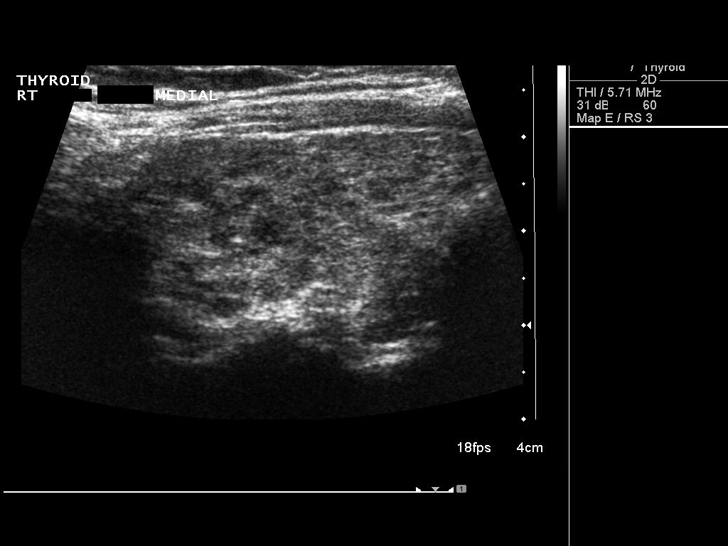
[im 6/31]
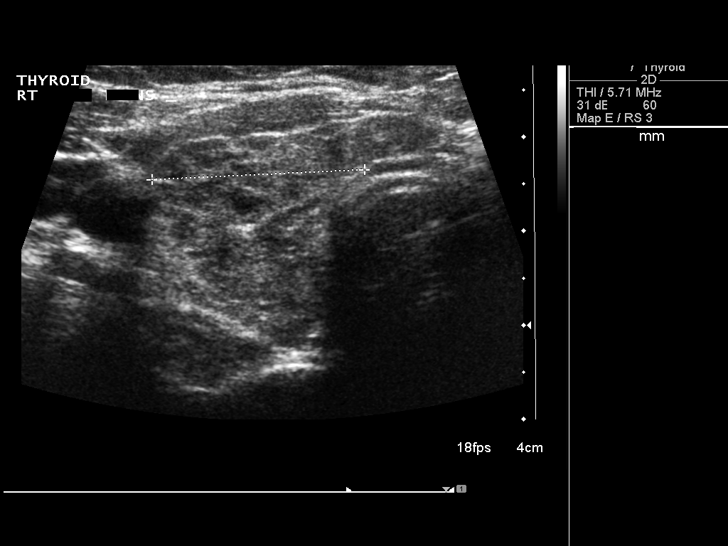
[im 8/31]
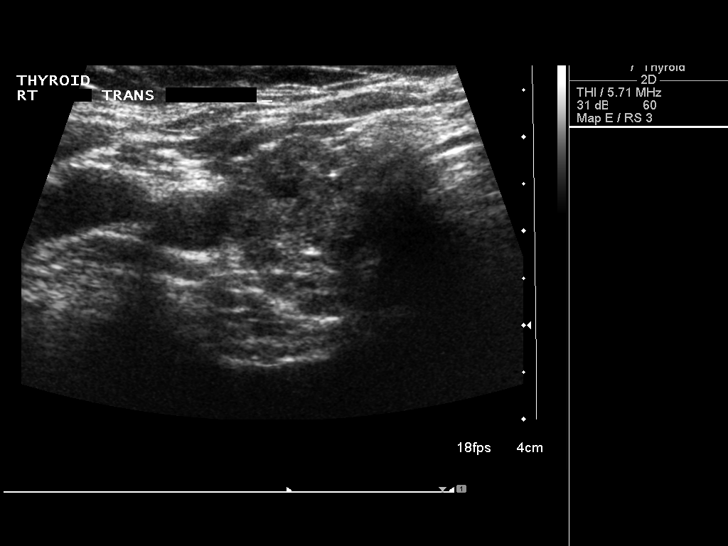
[im 11/31]
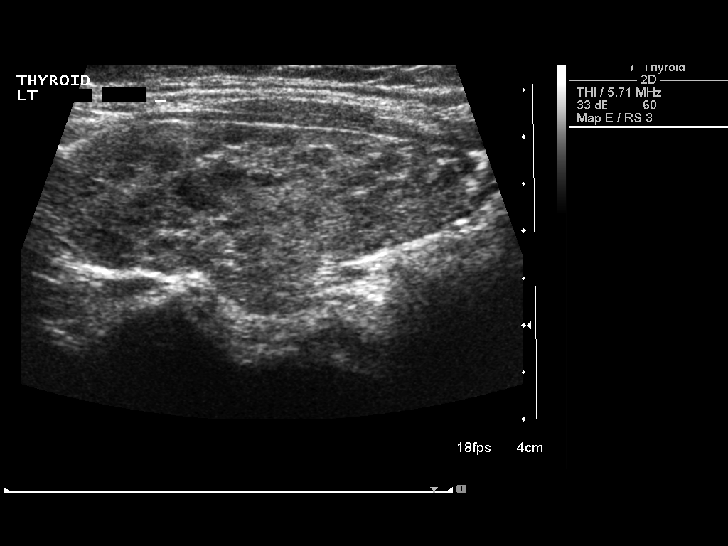
[im 12/31]
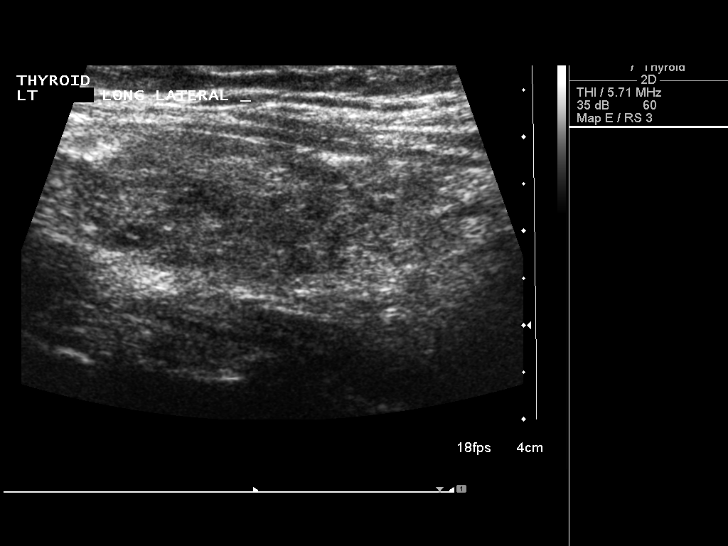
[im 14/31]
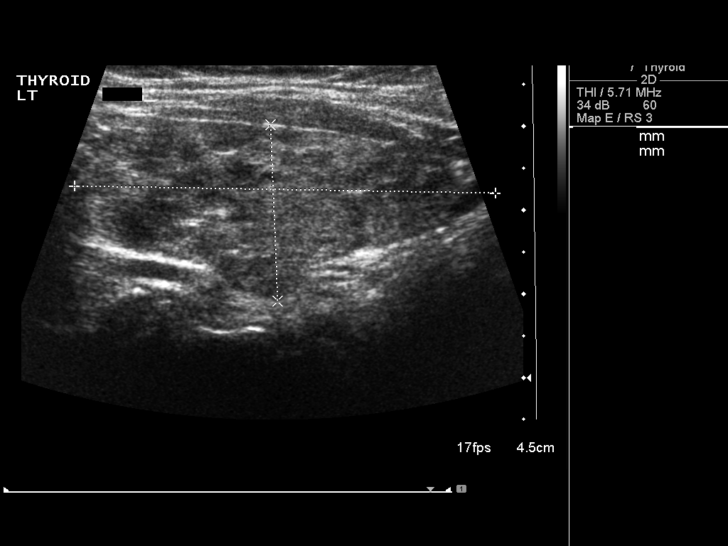
[im 17/31]
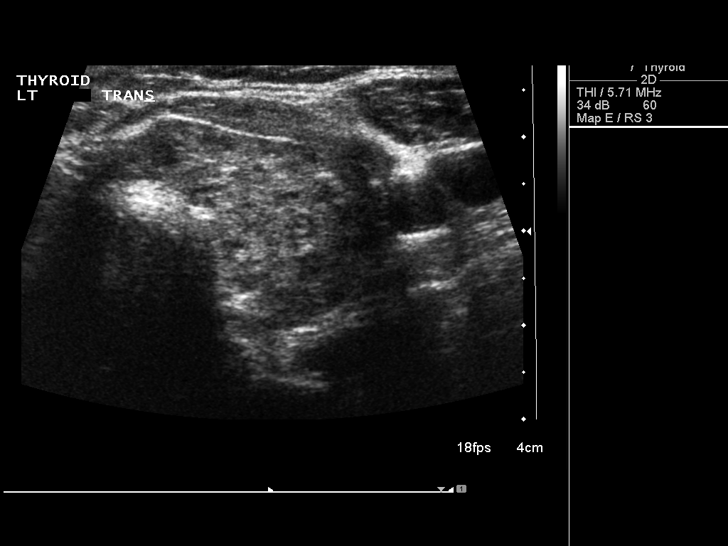
[im 19/31]
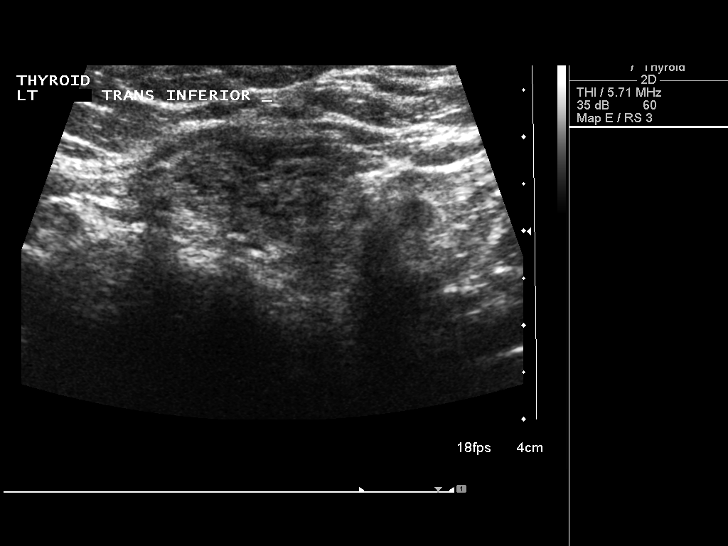
[im 21/31]
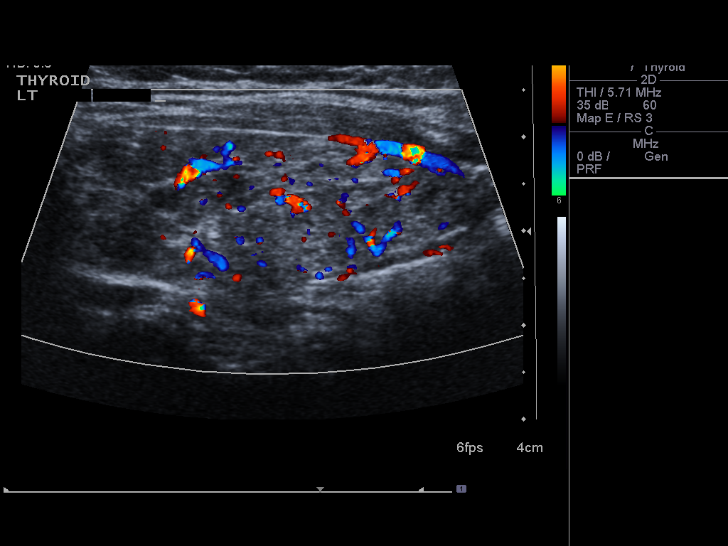
[im 23/31]
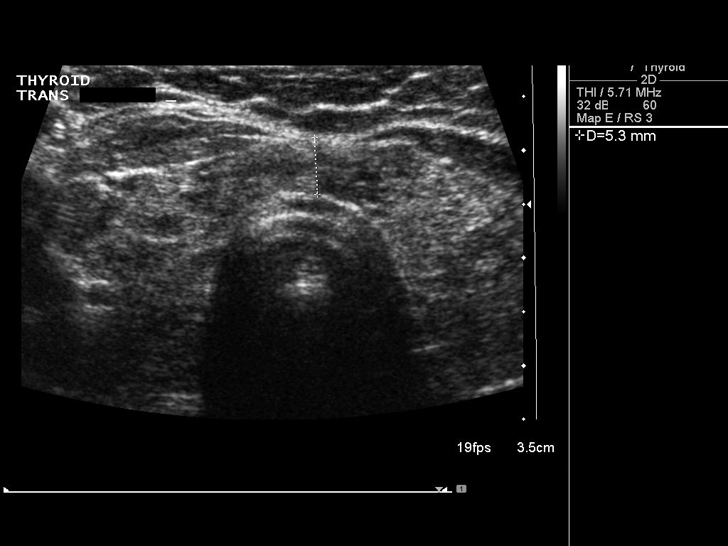
[im 26/31]
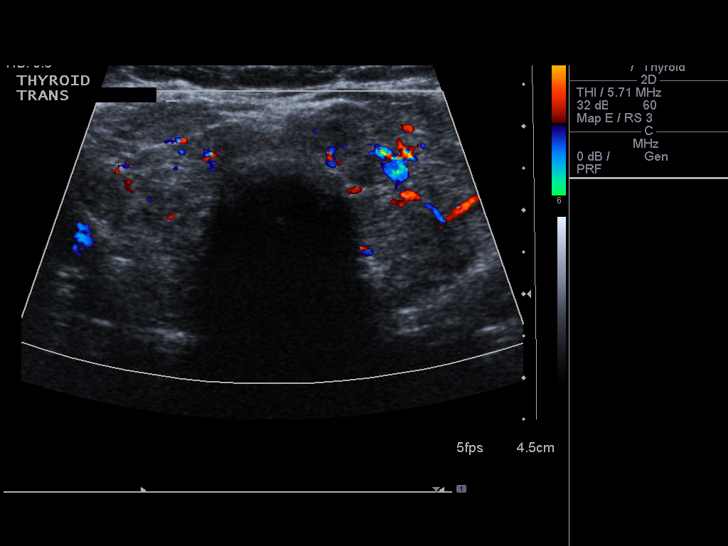
[im 28/31]
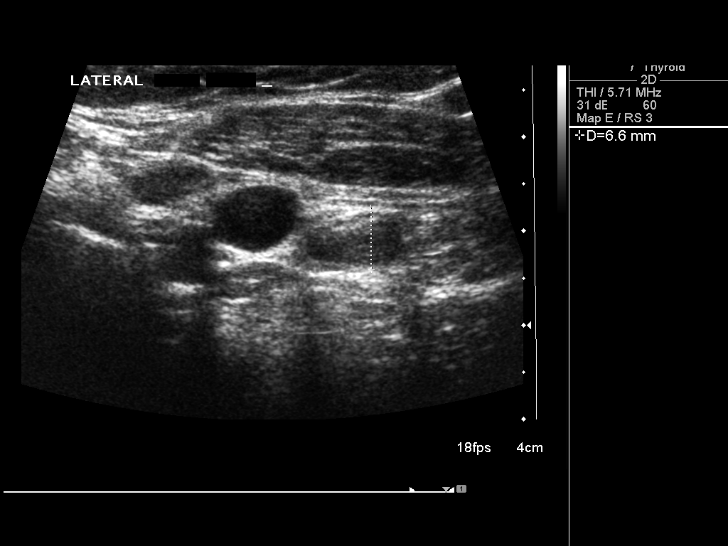
[im 31/31]
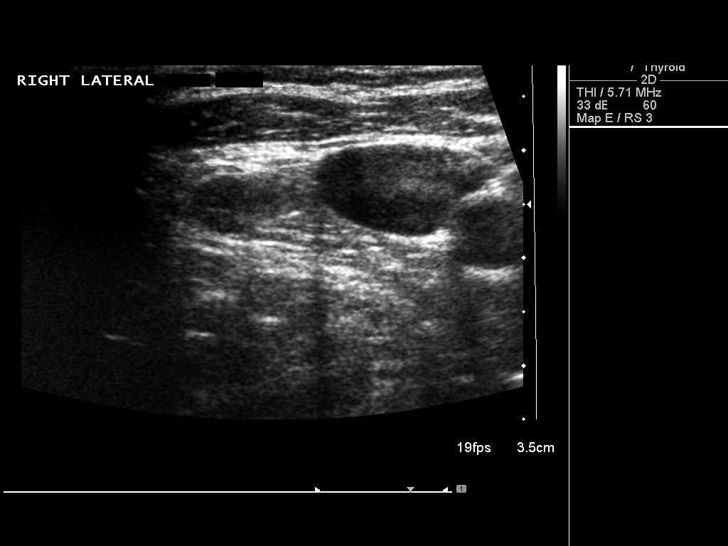

[14 of 25 positions shown; findings below may reference images not displayed]

FINDINGS: Right thyroid lobe:  Normal in size measuring 4.9 cm long X 1.8 cm
AP X 2.3 cm wide.
Left thyroid lobe:  Remains upper limits of normal in size
measuring 5.1 cm long X 2.0 cm AP X 2.4 cm wide.
Isthmus:  Slightly thickened 5 mm AP thickness.

Focal nodules:  Unchanged diffusemarked heterogeneous thyroid
echotexture again visualized.

No new focal solid or cystic thyroid lesions seen.

Lymphadenopathy:  None visualized.
IMPRESSION: 1.  Essentially stable markedly heterogeneous thyroid gland (upper
limits of normal in size with slightly thickened isthmus) with no
focal lesions consistent with subtle goiter and/or chronic
thyroiditis.
2.  No new significant abnormalities seen.

## 2012-02-21 ENCOUNTER — Ambulatory Visit (INDEPENDENT_AMBULATORY_CARE_PROVIDER_SITE_OTHER): Payer: 59 | Admitting: Physician Assistant

## 2012-02-21 ENCOUNTER — Encounter: Payer: Self-pay | Admitting: Physician Assistant

## 2012-02-21 VITALS — BP 128/92 | HR 85 | Ht 68.0 in | Wt 209.0 lb

## 2012-02-21 DIAGNOSIS — F411 Generalized anxiety disorder: Secondary | ICD-10-CM

## 2012-02-21 DIAGNOSIS — F419 Anxiety disorder, unspecified: Secondary | ICD-10-CM

## 2012-02-21 DIAGNOSIS — Z23 Encounter for immunization: Secondary | ICD-10-CM

## 2012-02-21 MED ORDER — CLONAZEPAM 1 MG PO TABS: ORAL_TABLET | ORAL | Status: DC

## 2012-02-21 NOTE — Progress Notes (Signed)
  Subjective:    Patient ID: Catherine Brooks, female    DOB: 11/25/1956, 56 y.o.   MRN: [id number]  HPI Patient is doing really well today and comes in for refill on klonapin. She has been on this for over 1 year and has been doing great. She takes 1/2 pill at night and sometimes when needed 1/4 pill during the day. She has not had any panic attacks. She has felt like some might be coming on but she is able to walk away breath and can usually prevent one from happening. She is sleeping very well and is happy with life right now.    Review of Systems     Objective:   Physical Exam  Constitutional: She is oriented to person, place, and time. She appears well-developed and well-nourished.  HENT:  Head: Normocephalic and atraumatic.  Cardiovascular: Normal rate, regular rhythm and normal heart sounds.   Pulmonary/Chest: Effort normal and breath sounds normal. She has no wheezes.  Neurological: She is alert and oriented to person, place, and time.  Psychiatric: She has a normal mood and affect. Her behavior is normal.          Assessment & Plan:  Anxiety- GAD-7 ws 2. PHQ-9 was 0. Klonapin was refilled for 6 months and then needs to be seen.   Tdap was given today.

## 2012-02-21 NOTE — Patient Instructions (Signed)
Continue on klonapin. Will recheck in 6 months.

## 2012-04-16 ENCOUNTER — Other Ambulatory Visit: Payer: Self-pay | Admitting: Family Medicine

## 2012-08-02 ENCOUNTER — Other Ambulatory Visit: Payer: Self-pay | Admitting: Family Medicine

## 2012-08-16 ENCOUNTER — Ambulatory Visit (INDEPENDENT_AMBULATORY_CARE_PROVIDER_SITE_OTHER): Payer: 59 | Admitting: Physician Assistant

## 2012-08-16 ENCOUNTER — Encounter: Payer: Self-pay | Admitting: Physician Assistant

## 2012-08-16 VITALS — BP 126/83 | HR 73 | Ht 68.0 in | Wt 212.0 lb

## 2012-08-16 DIAGNOSIS — F4000 Agoraphobia, unspecified: Secondary | ICD-10-CM

## 2012-08-16 DIAGNOSIS — F411 Generalized anxiety disorder: Secondary | ICD-10-CM

## 2012-08-16 DIAGNOSIS — F419 Anxiety disorder, unspecified: Secondary | ICD-10-CM

## 2012-08-16 DIAGNOSIS — Z13220 Encounter for screening for lipoid disorders: Secondary | ICD-10-CM

## 2012-08-16 DIAGNOSIS — Z131 Encounter for screening for diabetes mellitus: Secondary | ICD-10-CM

## 2012-08-16 DIAGNOSIS — Z79899 Other long term (current) drug therapy: Secondary | ICD-10-CM

## 2012-08-16 MED ORDER — CITALOPRAM HYDROBROMIDE 10 MG PO TABS: ORAL_TABLET | ORAL | Status: DC

## 2012-08-16 MED ORDER — CLONAZEPAM 1 MG PO TABS: ORAL_TABLET | ORAL | Status: DC

## 2012-08-16 NOTE — Patient Instructions (Signed)
Will call with results.   Start celexa and klonapin as needed.

## 2012-08-16 NOTE — Progress Notes (Signed)
  Subjective:    Patient ID: Catherine Brooks, female    DOB: [birth year], 56 y.o.   MRN: [id number]  HPI Patient is a 56 yo who presents to the clinic to follow up on Anxiety and history of agoraphobia.  She has started to find that she is taking klonapin more often that she was in the past. She takes it almost everyday now. She has had a lot of stressful situations going on with her husband having surgeries and her adoptive daughter's mental illness. She does not have agoraphobia any long because of being put on klonapin but she does feel like she is becoming more anxious all the time. She is able to sleep well. She has tired anti-depressants and they have made her gain weight and feel blah. She has not tried Celexa.   Gerd controlled, ongoing. Prilosec is working great.   Needs labs for cholesterol/Diabetes/B12.   Review of Systems     Objective:   Physical Exam  Constitutional: She is oriented to person, place, and time. She appears well-developed and well-nourished.  HENT:  Head: Normocephalic and atraumatic.  Cardiovascular: Normal rate, regular rhythm and normal heart sounds.   Pulmonary/Chest: Effort normal and breath sounds normal. She has no wheezes.  Neurological: She is alert and oriented to person, place, and time.  Skin: Skin is warm and dry.  Psychiatric: She has a normal mood and affect. Her behavior is normal.          Assessment & Plan:  History of agoraphobia/Anxiety- discussed with patient since she is having to use her klonapin more frequently that it might be beneficial to try something for more generalized anxiety. Gave her prescription for celexa. She reports to be very sensitive to medications so starting dose was 1/2 tab of 10mg  daily for 7-14 days then increase to 1 tab. Pt is aware of side effects and if worsening depression or weird thoughts occure to stop medication and call office. Will follow up in 6 weeks after starting medication. Refilled klonapin  and told patient to only use PRN.   Screening for lipiod/diabetes- Gave lab slip. Pt aware she needs to be fasting.   Medication management- Taking B12 and need to get lab of b12.  GERD- Controlled with Prilosec. Refilled.

## 2012-08-23 ENCOUNTER — Ambulatory Visit: Payer: 59 | Admitting: Physician Assistant

## 2012-09-01 ENCOUNTER — Other Ambulatory Visit: Payer: Self-pay | Admitting: Family Medicine

## 2012-09-01 DIAGNOSIS — Z1231 Encounter for screening mammogram for malignant neoplasm of breast: Secondary | ICD-10-CM

## 2012-09-01 LAB — LIPID PANEL
HDL: 44 mg/dL (ref >39)
Total CHOL/HDL Ratio: 4 Ratio
Triglycerides: 125 mg/dL (ref <150)

## 2012-09-01 LAB — COMPREHENSIVE METABOLIC PANEL
ALT: 19 U/L (ref 0–35)
Albumin: 4.2 g/dL (ref 3.5–5.2)
Alkaline Phosphatase: 79 U/L (ref 39–117)
Calcium: 9.2 mg/dL (ref 8.4–10.5)
Potassium: 4.2 mEq/L (ref 3.5–5.3)
Total Protein: 6.8 g/dL (ref 6.0–8.3)

## 2012-09-01 LAB — VITAMIN B12: Vitamin B-12: 344 pg/mL (ref 211–911)

## 2012-09-02 LAB — VITAMIN D 25 HYDROXY (VIT D DEFICIENCY, FRACTURES): Vit D, 25-Hydroxy: 33 ng/mL (ref 30–89)

## 2012-09-12 ENCOUNTER — Ambulatory Visit (INDEPENDENT_AMBULATORY_CARE_PROVIDER_SITE_OTHER): Payer: 59

## 2012-09-12 DIAGNOSIS — Z1231 Encounter for screening mammogram for malignant neoplasm of breast: Secondary | ICD-10-CM

## 2012-11-30 ENCOUNTER — Other Ambulatory Visit: Payer: Self-pay | Admitting: Family Medicine

## 2012-11-30 NOTE — Telephone Encounter (Signed)
Needs appt before future refills

## 2013-01-21 ENCOUNTER — Other Ambulatory Visit: Payer: Self-pay | Admitting: Physician Assistant

## 2013-02-07 ENCOUNTER — Other Ambulatory Visit: Payer: Self-pay | Admitting: Physician Assistant

## 2013-02-08 ENCOUNTER — Telehealth: Payer: Self-pay | Admitting: *Deleted

## 2013-02-08 DIAGNOSIS — F419 Anxiety disorder, unspecified: Secondary | ICD-10-CM

## 2013-02-08 NOTE — Telephone Encounter (Signed)
Pt calls & states that we denied her refill for clonazepam.  It was denied because she needs an appt.  She states that she spoke with you the other day about not being able to take the celexa you prescribed her so she uses the clonazepam.  I told her that I wanted to speak to you first before I filled it in case you wanted her to come in for an appt.

## 2013-02-09 MED ORDER — CLONAZEPAM 1 MG PO TABS: ORAL_TABLET | ORAL | Status: DC

## 2013-02-09 NOTE — Telephone Encounter (Signed)
Spoke with pt and notified med faxed to pharmacy and needs to schedule followup. Barry Dienes, LPN

## 2013-02-09 NOTE — Telephone Encounter (Signed)
Will refill for one month. It has been 6 months since last appt so does need appt. In next month. Printed rx.

## 2013-02-24 ENCOUNTER — Other Ambulatory Visit: Payer: Self-pay | Admitting: Physician Assistant

## 2013-03-05 ENCOUNTER — Encounter: Payer: Self-pay | Admitting: Physician Assistant

## 2013-03-05 ENCOUNTER — Ambulatory Visit (HOSPITAL_BASED_OUTPATIENT_CLINIC_OR_DEPARTMENT_OTHER)
Admission: RE | Admit: 2013-03-05 | Discharge: 2013-03-05 | Disposition: A | Discharge status: Home / Self Care | Payer: 59 | Source: Ambulatory Visit | Attending: Physician Assistant | Admitting: Physician Assistant

## 2013-03-05 ENCOUNTER — Ambulatory Visit (INDEPENDENT_AMBULATORY_CARE_PROVIDER_SITE_OTHER): Payer: 59 | Admitting: Physician Assistant

## 2013-03-05 VITALS — BP 131/77 | HR 82 | Wt 205.0 lb

## 2013-03-05 DIAGNOSIS — Z9181 History of falling: Secondary | ICD-10-CM

## 2013-03-05 DIAGNOSIS — F411 Generalized anxiety disorder: Secondary | ICD-10-CM

## 2013-03-05 DIAGNOSIS — F419 Anxiety disorder, unspecified: Secondary | ICD-10-CM

## 2013-03-05 DIAGNOSIS — R209 Unspecified disturbances of skin sensation: Secondary | ICD-10-CM | POA: Insufficient documentation

## 2013-03-05 DIAGNOSIS — M25559 Pain in unspecified hip: Secondary | ICD-10-CM

## 2013-03-05 DIAGNOSIS — M25552 Pain in left hip: Secondary | ICD-10-CM

## 2013-03-05 DIAGNOSIS — Z780 Asymptomatic menopausal state: Secondary | ICD-10-CM

## 2013-03-05 MED ORDER — CLONAZEPAM 1 MG PO TABS: ORAL_TABLET | ORAL | Status: DC

## 2013-03-05 MED ORDER — MELOXICAM 7.5 MG PO TABS: 7.5000 mg | ORAL_TABLET | Freq: Every day | ORAL | Status: DC

## 2013-03-05 NOTE — Patient Instructions (Addendum)
Use mobic for 2 weeks daily then as needed. Bone Density will order.

## 2013-03-05 NOTE — Progress Notes (Signed)
  Subjective:    Patient ID: Catherine Brooks, female    DOB: 1956/04/17, 57 y.o.   MRN: [id number]  HPI Patient presents to the clinic with left hip pain that has been ongoing for over 1 year off and on. She does not like to take medications and has not taken anything for it. The pain has worsened over the last 3 months. She has also fallen 4 times in the last 3 months. Mostly on ice but once over a cord in the living room. The pain is worse in the morning after she stands and anytime she goes from sitting to standing. Her hip often feels like it catches. She denies falling because of hip pain. At best 4/10 at worse 8/10. There is some numbness after sitting in her left thigh. She has never had a bone density.   Anxiety well controlled on klonapin. No problems or concerns.   Review of Systems     Objective:   Physical Exam  Constitutional: She is oriented to person, place, and time. She appears well-developed and well-nourished.  HENT:  Head: Normocephalic and atraumatic.  Cardiovascular: Normal rate, regular rhythm and normal heart sounds.   Pulmonary/Chest: Effort normal and breath sounds normal.  Musculoskeletal:  No pain over spine. Pain over left buttocks area to palpation. No pain with palpation over bursa of hip. Negative straight leg raise bilaterally. Normal ROM passively and actively with minimal pain.   Neurological: She is alert and oriented to person, place, and time.  Skin: Skin is warm and dry.  Psychiatric: She has a normal mood and affect. Her behavior is normal.          Assessment & Plan:  Left hip pain- suspect osteoarthritis. Will get xrays of hip. Started on mobic for 2 weeks then as needed for hip pain. Discussed stretches and icing. Talked about glucosamin chodrotin. Will follow up in 4 weeks. Will refer for bone density.   Anxiety- Well controlled on klonapin. Refilled.

## 2013-03-27 ENCOUNTER — Other Ambulatory Visit: Payer: Self-pay | Admitting: Physician Assistant

## 2013-04-27 ENCOUNTER — Other Ambulatory Visit: Payer: Self-pay | Admitting: Physician Assistant

## 2013-05-24 ENCOUNTER — Encounter: Payer: 59 | Admitting: Obstetrics & Gynecology

## 2013-06-19 ENCOUNTER — Other Ambulatory Visit: Payer: Self-pay | Admitting: Physician Assistant

## 2013-06-20 ENCOUNTER — Other Ambulatory Visit: Payer: Self-pay | Admitting: Physician Assistant

## 2013-07-31 ENCOUNTER — Telehealth: Payer: Self-pay | Admitting: Endocrinology

## 2013-07-31 MED ORDER — LEVOTHYROXINE SODIUM 50 MCG PO TABS: 50.0000 ug | ORAL_TABLET | Freq: Every day | ORAL | Status: DC

## 2013-07-31 NOTE — Telephone Encounter (Signed)
rx sent

## 2013-08-01 ENCOUNTER — Telehealth: Payer: Self-pay | Admitting: Endocrinology

## 2013-08-01 MED ORDER — LEVOTHYROXINE SODIUM 75 MCG PO TABS: 75.0000 ug | ORAL_TABLET | Freq: Every day | ORAL | Status: DC

## 2013-08-01 NOTE — Telephone Encounter (Signed)
rx sent

## 2013-08-13 ENCOUNTER — Other Ambulatory Visit: Payer: Self-pay | Admitting: Physician Assistant

## 2013-08-16 ENCOUNTER — Other Ambulatory Visit: Payer: Self-pay | Admitting: Physician Assistant

## 2013-08-17 ENCOUNTER — Other Ambulatory Visit: Payer: Self-pay | Admitting: Physician Assistant

## 2013-08-30 ENCOUNTER — Telehealth: Payer: Self-pay | Admitting: *Deleted

## 2013-08-30 DIAGNOSIS — E039 Hypothyroidism, unspecified: Secondary | ICD-10-CM

## 2013-08-30 NOTE — Telephone Encounter (Signed)
Pt calls this morning & asks if you will take over the management of her thyroid medication.  Her doctor that was handling this previously has left the office.  I told her that wouldn't be a problem but I wasn't sure if you would like for her to come in to see you first or if we could just order some labs.  Please advise.

## 2013-08-30 NOTE — Telephone Encounter (Signed)
Go ahead and order TSH and free T4. Last labs were abnormal. Schedule appt after getting labs so we can go over them.

## 2013-08-30 NOTE — Telephone Encounter (Signed)
Labs ordered, fax downstairs, & pt notified via vm.

## 2013-08-31 ENCOUNTER — Other Ambulatory Visit: Payer: Self-pay | Admitting: Physician Assistant

## 2013-08-31 LAB — TSH: TSH: 5.169 u[IU]/mL — ABNORMAL HIGH (ref 0.350–4.500)

## 2013-08-31 LAB — T4, FREE: Free T4: 1.14 ng/dL (ref 0.80–1.80)

## 2013-08-31 MED ORDER — SYNTHROID 88 MCG PO TABS: 88.0000 ug | ORAL_TABLET | Freq: Every day | ORAL | Status: DC

## 2013-09-12 ENCOUNTER — Ambulatory Visit: Payer: 59 | Admitting: Physician Assistant

## 2013-09-17 ENCOUNTER — Ambulatory Visit: Payer: 59 | Admitting: Physician Assistant

## 2013-10-08 ENCOUNTER — Ambulatory Visit (INDEPENDENT_AMBULATORY_CARE_PROVIDER_SITE_OTHER): Payer: 59 | Admitting: Physician Assistant

## 2013-10-08 ENCOUNTER — Encounter: Payer: Self-pay | Admitting: Physician Assistant

## 2013-10-08 VITALS — BP 119/75 | HR 82 | Wt 204.0 lb

## 2013-10-08 DIAGNOSIS — Z23 Encounter for immunization: Secondary | ICD-10-CM

## 2013-10-08 DIAGNOSIS — F411 Generalized anxiety disorder: Secondary | ICD-10-CM

## 2013-10-08 DIAGNOSIS — K219 Gastro-esophageal reflux disease without esophagitis: Secondary | ICD-10-CM

## 2013-10-08 DIAGNOSIS — E039 Hypothyroidism, unspecified: Secondary | ICD-10-CM

## 2013-10-08 LAB — TSH: TSH: 1.868 u[IU]/mL (ref 0.350–4.500)

## 2013-10-08 MED ORDER — CLONAZEPAM 1 MG PO TABS: ORAL_TABLET | ORAL | Status: DC

## 2013-10-08 MED ORDER — ESOMEPRAZOLE MAGNESIUM 40 MG PO PACK: 40.0000 mg | PACK | Freq: Every day | ORAL | Status: DC

## 2013-10-08 NOTE — Patient Instructions (Signed)

## 2013-10-08 NOTE — Progress Notes (Signed)
  Subjective:    Patient ID: Catherine Brooks, female    DOB: [birth year], 57 y.o.   MRN: [id number]  HPI Patient is a 57 yo female who presents to the clinic to follow up on hypothyroidism. Last TSH was a little over 5. We increased synthroid to . She does feel like she has a little more energy. She is always stressed. She does take clonazepam with synthroid due to allergic sensitivity to synthroid. 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   She feels like prilosec is not helping her acid reflux symptoms as much any more. She takes daily and not with her synthroid. She continues to have bad taste that is worse at night and when she lays down. She is trying to keep a GERD diet but admits to eating meals that worsen symptoms. NO nausea or vomiting.    Review of Systems     Objective:   Physical Exam  Constitutional: She is oriented to person, place, and time. She appears well-developed and well-nourished.  HENT:  Head: Normocephalic and atraumatic.  Neck: Normal range of motion. Neck supple. No thyromegaly present.  Cardiovascular: Normal rate, regular rhythm and normal heart sounds.   Pulmonary/Chest: Effort normal and breath sounds normal.  Neurological: She is alert and oriented to person, place, and time.  Skin: Skin is warm and dry.  Psychiatric: She has a normal mood and affect. Her behavior is normal.          Assessment & Plan:  Hypothyroidism- rechecked labs. Will follow up as needed.   Anxiety- klonapin refilled.  GERD- gave GERD diet HO. Stop prilosec. Start nexium daily. Follow up if not improving or worsening in the next 5-7 days.   Flu shot given today without complication.

## 2013-10-10 ENCOUNTER — Telehealth: Payer: Self-pay

## 2013-10-10 MED ORDER — ESOMEPRAZOLE MAGNESIUM 40 MG PO CPDR: 40.0000 mg | DELAYED_RELEASE_CAPSULE | Freq: Every day | ORAL | Status: DC

## 2013-10-10 NOTE — Telephone Encounter (Signed)
Ok to switch 

## 2013-10-10 NOTE — Telephone Encounter (Signed)
Sent new prescription to pharmacy

## 2013-10-10 NOTE — Telephone Encounter (Signed)
Patient would rather take the tablets of nexium instead of the powder packets. Is it ok to switch?

## 2013-10-26 ENCOUNTER — Other Ambulatory Visit: Payer: Self-pay | Admitting: Physician Assistant

## 2013-11-05 ENCOUNTER — Encounter: Payer: Self-pay | Admitting: Physician Assistant

## 2013-11-05 ENCOUNTER — Ambulatory Visit (INDEPENDENT_AMBULATORY_CARE_PROVIDER_SITE_OTHER): Payer: 59 | Admitting: Physician Assistant

## 2013-11-05 VITALS — BP 122/76 | HR 82 | Temp 97.8°F | Wt 206.0 lb

## 2013-11-05 DIAGNOSIS — H6980 Other specified disorders of Eustachian tube, unspecified ear: Secondary | ICD-10-CM

## 2013-11-05 DIAGNOSIS — H6981 Other specified disorders of Eustachian tube, right ear: Secondary | ICD-10-CM

## 2013-11-05 MED ORDER — FLUTICASONE PROPIONATE 50 MCG/ACT NA SUSP: 2.0000 | Freq: Every day | NASAL | Status: DC

## 2013-11-05 MED ORDER — METHYLPREDNISOLONE 4 MG PO KIT: PACK | ORAL | Status: DC

## 2013-11-05 NOTE — Progress Notes (Signed)
   Subjective:    Patient ID: Catherine Brooks, female    DOB: 1956/09/26, 57 y.o.   MRN: [id number]  HPI Patient is a 57 yo woman who presents to the clinic with right ear pain. 2 weeks ago she started getting dizzy when she changed positions. She took sudafed and went away. 2 days ago she started having pain in her right ear with pain radiating down right side of neck. She has taken sudafed and has not helped. No ear discharge, fever, chills, cough. She has had some sinus pressure. No ST.     Review of Systems     Objective:   Physical Exam  Constitutional: She appears well-developed and well-nourished.  HENT:  Head: Normocephalic and atraumatic.  Left Ear: External ear normal.  Nose: Nose normal.  Mouth/Throat: Oropharynx is clear and moist. No oropharyngeal exudate.  Pain with palpation over right tragus. External right and left ear look normal. Left TM clear. Right TM some dullness over tM but able to view ossicles. NO blood or pus.   Eyes: Conjunctivae are normal. Right eye exhibits no discharge. Left eye exhibits no discharge.  Neck: Normal range of motion. Neck supple.  Pulmonary/Chest: Effort normal and breath sounds normal.  Lymphadenopathy:    She has no cervical adenopathy.  Skin: Skin is warm and dry.  Psychiatric: She has a normal mood and affect. Her behavior is normal.          Assessment & Plan:  Eustachian tube dysfunction- Reassured pt did not look infected today. Gave nasal spray and medrol dose pack. Call if not improving or if symptoms worsen. Continue to take sudafed.

## 2013-11-05 NOTE — Patient Instructions (Signed)
Barotitis Media Barotitis media is soreness (inflammation) of the area behind the eardrum (middle ear). This occurs when the auditory tube (Eustachian tube) leading from the back of the throat to the eardrum is blocked. When it is blocked air cannot move in and out of the middle ear to equalize pressure changes. These pressure changes come from changes in altitude when:  Flying.  Driving in the mountains.  Diving. Problems are more likely to occur with pressure changes during times when you are congested as from:  Hay fever.  Upper respiratory infection.  A cold. Damage or hearing loss (barotrauma) caused by this may be permanent. HOME CARE INSTRUCTIONS   Use medicines as recommended by your caregiver. Over the counter medicines will help unblock the canal and can help during times of air travel.  Do not put anything into your ears to clean or unplug them. Eardrops will not be helpful.  Do not swim, dive, or fly until your caregiver says it is all right to do so. If these activities are necessary, chewing gum with frequent swallowing may help. It is also helpful to hold your nose and gently blow to pop your ears for equalizing pressure changes. This forces air into the Eustachian tube.  For little ones with problems, give your baby a bottle of water or juice during periods when pressure changes would be anticipated such as during take offs and landings associated with air travel.  Only take over-the-counter or prescription medicines for pain, discomfort, or fever as directed by your caregiver.  A decongestant may be helpful in de-congesting the middle ear and make pressure equalization easier. This can be even more effective if the drops (spray) are delivered with the head lying over the edge of a bed with the head tilted toward the ear on the affected side.  If your caregiver has given you a follow-up appointment, it is very important to keep that appointment. Not keeping the  appointment could result in a chronic or permanent injury, pain, hearing loss and disability. If there is any problem keeping the appointment, you must call back to this facility for assistance. SEEK IMMEDIATE MEDICAL CARE IF:   You develop a severe headache, dizziness, severe ear pain, or bloody or pus-like drainage from your ears.  An oral temperature above 102 F (38.9 C) develops.  Your problems do not improve or become worse. MAKE SURE YOU:   Understand these instructions.  Will watch your condition.  Will get help right away if you are not doing well or get worse. Document Released: 11/12/2000 Document Revised: 02/07/2012 Document Reviewed: 06/12/2013 [employer] Patient Information 2014 [employer].  

## 2013-11-13 ENCOUNTER — Telehealth: Payer: Self-pay | Admitting: Physician Assistant

## 2013-11-13 NOTE — Telephone Encounter (Signed)
Ms Wyatt Portela called requesting a referral for both her daughters to a developmental pediatrician

## 2013-11-14 NOTE — Telephone Encounter (Signed)
Done

## 2013-11-15 ENCOUNTER — Telehealth: Payer: Self-pay | Admitting: *Deleted

## 2013-11-15 NOTE — Telephone Encounter (Signed)
Opened in error

## 2013-12-06 ENCOUNTER — Other Ambulatory Visit: Payer: Self-pay | Admitting: Physician Assistant

## 2013-12-28 ENCOUNTER — Telehealth: Payer: Self-pay | Admitting: Endocrinology

## 2013-12-28 NOTE — Telephone Encounter (Signed)
Per Dr. Lucianne MussKumar this pt needs a follow up with labs in April. Attempted to call the pt per his request. We have no valid number

## 2014-01-23 ENCOUNTER — Other Ambulatory Visit: Payer: Self-pay | Admitting: Physician Assistant

## 2014-01-28 ENCOUNTER — Encounter: Payer: Self-pay | Admitting: Physician Assistant

## 2014-01-28 ENCOUNTER — Other Ambulatory Visit: Payer: Self-pay | Admitting: *Deleted

## 2014-01-28 ENCOUNTER — Ambulatory Visit (INDEPENDENT_AMBULATORY_CARE_PROVIDER_SITE_OTHER): Payer: 59 | Admitting: Physician Assistant

## 2014-01-28 VITALS — BP 130/83 | HR 93 | Temp 98.0°F | Wt 207.0 lb

## 2014-01-28 DIAGNOSIS — B9689 Other specified bacterial agents as the cause of diseases classified elsewhere: Secondary | ICD-10-CM

## 2014-01-28 DIAGNOSIS — A499 Bacterial infection, unspecified: Secondary | ICD-10-CM

## 2014-01-28 DIAGNOSIS — J329 Chronic sinusitis, unspecified: Secondary | ICD-10-CM

## 2014-01-28 MED ORDER — AZITHROMYCIN 250 MG PO TABS: ORAL_TABLET | ORAL | Status: DC

## 2014-01-28 MED ORDER — CLONAZEPAM 1 MG PO TABS: ORAL_TABLET | ORAL | Status: DC

## 2014-01-28 NOTE — Patient Instructions (Signed)

## 2014-01-29 NOTE — Progress Notes (Signed)
   Subjective:    Patient ID: Catherine Brooks, female    DOB: 05-02-1956, 58 y.o.   MRN: [id number]  HPI Patient is a 58 year old female who presents to the clinic with 2 weeks of sinus pressure, headache, cough. Patient has a history of the sinus infections a year. She has tried over-the-counter decongestants such as Sudafed as well as Mucinex D. They seemed to help a little bit but then the symptoms come back. Most of her pressure is over her maxillary sinuses. She denies any fever, chills, nausea, vomiting or abdominal pain. She does have a dry cough. She denies any wheezing or shortness of breath.   Review of Systems     Objective:   Physical Exam  Constitutional: She is oriented to person, place, and time. She appears well-developed and well-nourished.  HENT:  Head: Normocephalic and atraumatic.  Right Ear: External ear normal.  Left Ear: External ear normal.  Nose: Nose normal.  Mouth/Throat: Oropharynx is clear and moist. No oropharyngeal exudate.  TMs clear bilaterally.  Positive for maxillary tenderness to palpation bilaterally.  Eyes: Conjunctivae are normal. Right eye exhibits no discharge. Left eye exhibits no discharge.  Neck: Normal range of motion. Neck supple.  Cardiovascular: Normal rate, regular rhythm and normal heart sounds.   Pulmonary/Chest: Effort normal and breath sounds normal. She has no wheezes.  Lymphadenopathy:    She has no cervical adenopathy.  Neurological: She is alert and oriented to person, place, and time.  Skin: Skin is dry.  Flushed cheeks bilaterally.  Psychiatric: She has a normal mood and affect. Her behavior is normal.          Assessment & Plan:  Bacterial sinusitis-treated with Z-Pak 2 to penicillin allergy. Medrol Dosepak was also given. Continue with Flonase. Other symptomatic care was discussed. If not improving followup.

## 2014-02-06 ENCOUNTER — Other Ambulatory Visit: Payer: Self-pay | Admitting: Physician Assistant

## 2014-02-12 ENCOUNTER — Other Ambulatory Visit: Payer: Self-pay | Admitting: *Deleted

## 2014-02-12 MED ORDER — ESOMEPRAZOLE MAGNESIUM 40 MG PO CPDR: DELAYED_RELEASE_CAPSULE | ORAL | Status: DC

## 2014-04-15 ENCOUNTER — Other Ambulatory Visit: Payer: Self-pay | Admitting: Physician Assistant

## 2014-04-30 ENCOUNTER — Other Ambulatory Visit: Payer: Self-pay | Admitting: *Deleted

## 2014-04-30 MED ORDER — SYNTHROID 88 MCG PO TABS: ORAL_TABLET | ORAL | Status: DC

## 2014-06-03 ENCOUNTER — Other Ambulatory Visit: Payer: Self-pay | Admitting: *Deleted

## 2014-06-03 MED ORDER — CLONAZEPAM 1 MG PO TABS: ORAL_TABLET | ORAL | Status: DC

## 2014-07-10 ENCOUNTER — Ambulatory Visit (INDEPENDENT_AMBULATORY_CARE_PROVIDER_SITE_OTHER): Payer: 59 | Admitting: Physician Assistant

## 2014-07-10 ENCOUNTER — Encounter: Payer: Self-pay | Admitting: Physician Assistant

## 2014-07-10 VITALS — BP 134/73 | HR 76 | Ht 68.0 in | Wt 213.0 lb

## 2014-07-10 DIAGNOSIS — F411 Generalized anxiety disorder: Secondary | ICD-10-CM

## 2014-07-10 DIAGNOSIS — E038 Other specified hypothyroidism: Secondary | ICD-10-CM

## 2014-07-10 DIAGNOSIS — K219 Gastro-esophageal reflux disease without esophagitis: Secondary | ICD-10-CM

## 2014-07-10 MED ORDER — ESOMEPRAZOLE MAGNESIUM 40 MG PO CPDR: DELAYED_RELEASE_CAPSULE | ORAL | Status: DC

## 2014-07-10 MED ORDER — CLONAZEPAM 1 MG PO TABS: ORAL_TABLET | ORAL | Status: AC

## 2014-07-11 LAB — T4, FREE: Free T4: 1.01 ng/dL (ref 0.80–1.80)

## 2014-07-11 LAB — TSH: TSH: 4.699 u[IU]/mL — ABNORMAL HIGH (ref 0.350–4.500)

## 2014-07-12 ENCOUNTER — Other Ambulatory Visit: Payer: Self-pay | Admitting: Physician Assistant

## 2014-07-12 MED ORDER — SYNTHROID 100 MCG PO TABS: 100.0000 ug | ORAL_TABLET | Freq: Every day | ORAL | Status: AC

## 2014-07-12 MED ORDER — SYNTHROID 100 MCG PO TABS: 100.0000 ug | ORAL_TABLET | Freq: Every day | ORAL | Status: DC

## 2014-07-12 NOTE — Progress Notes (Signed)
Patient aware. Clotine Heiner, CMA 

## 2014-07-12 NOTE — Progress Notes (Signed)
   Subjective:    Patient ID: Catherine Brooks, female    DOB: Sep 02, 1956, 58 y.o.   MRN: [id number]  HPI Patient presents to the clinic to followup and get medication refills. [place] moving to [place] in 2 weeks. We will have to establish care and would like prescriptions to give him some time to do so. Patient has no complaints or concerns today.  Hypothyroidism-she takes branded Synthroid daily.  GERD- she takes Nexium daily. Symptoms are controlled.  Anxiety-patient takes Klonopin as needed.   Review of Systems  All other systems reviewed and are negative.      Objective:   Physical Exam  Constitutional: She is oriented to person, place, and time. She appears well-developed and well-nourished.  HENT:  Head: Normocephalic and atraumatic.  Neck: Normal range of motion. Neck supple. No thyromegaly present.  Cardiovascular: Normal rate, regular rhythm and normal heart sounds.   Pulmonary/Chest: Effort normal and breath sounds normal.  Neurological: She is alert and oriented to person, place, and time.  Skin: Skin is dry.  Psychiatric: She has a normal mood and affect. Her behavior is normal.          Assessment & Plan:  GERD-refilled Nexium for 6 months.  Hypothyroidism-will check labs and refill accordingly for 6 months.  Anxiety refills given on Klonopin.

## 2014-10-08 ENCOUNTER — Other Ambulatory Visit: Payer: Self-pay | Admitting: Physician Assistant
